# Patient Record
Sex: Female | Born: 1975 | Race: White | Hispanic: Yes | Marital: Married | Smoking: Never smoker
Health system: Southern US, Community
[De-identification: ages and names within clinical notes are randomized; demographics above are authoritative.]

## PROBLEM LIST (undated history)

## (undated) DIAGNOSIS — D649 Anemia, unspecified: Secondary | ICD-10-CM

## (undated) DIAGNOSIS — R002 Palpitations: Secondary | ICD-10-CM

## (undated) HISTORY — DX: Palpitations: R00.2

## (undated) HISTORY — DX: Anemia, unspecified: D64.9

---

## 1998-12-15 ENCOUNTER — Other Ambulatory Visit: Admission: RE | Admit: 1998-12-15 | Discharge: 1998-12-15 | Payer: Self-pay | Admitting: Orthopedic Surgery

## 2002-12-12 HISTORY — PX: OTHER SURGICAL HISTORY: SHX169

## 2005-11-14 ENCOUNTER — Other Ambulatory Visit: Admission: RE | Admit: 2005-11-14 | Discharge: 2005-11-14 | Payer: Self-pay | Admitting: Obstetrics and Gynecology

## 2007-01-11 ENCOUNTER — Inpatient Hospital Stay (HOSPITAL_COMMUNITY): Admission: AD | Admit: 2007-01-11 | Discharge: 2007-01-13 | Payer: Self-pay | Admitting: Obstetrics & Gynecology

## 2007-01-11 ENCOUNTER — Inpatient Hospital Stay (HOSPITAL_COMMUNITY): Admission: AD | Admit: 2007-01-11 | Discharge: 2007-01-11 | Payer: Self-pay | Admitting: Obstetrics & Gynecology

## 2009-08-12 LAB — CONVERTED CEMR LAB
Pap Smear: NORMAL
Pap Smear: NORMAL

## 2009-08-12 LAB — HM PAP SMEAR

## 2010-03-15 ENCOUNTER — Inpatient Hospital Stay (HOSPITAL_COMMUNITY): Admission: AD | Admit: 2010-03-15 | Discharge: 2010-03-17 | Payer: Self-pay | Admitting: Obstetrics & Gynecology

## 2010-05-26 LAB — HM DIABETES EYE EXAM: HM Diabetic Eye Exam: NORMAL

## 2010-08-11 ENCOUNTER — Ambulatory Visit: Payer: Self-pay | Admitting: Family Medicine

## 2010-08-11 DIAGNOSIS — O99019 Anemia complicating pregnancy, unspecified trimester: Secondary | ICD-10-CM | POA: Insufficient documentation

## 2010-08-11 DIAGNOSIS — L909 Atrophic disorder of skin, unspecified: Secondary | ICD-10-CM | POA: Insufficient documentation

## 2010-08-11 DIAGNOSIS — L989 Disorder of the skin and subcutaneous tissue, unspecified: Secondary | ICD-10-CM | POA: Insufficient documentation

## 2010-08-11 DIAGNOSIS — L919 Hypertrophic disorder of the skin, unspecified: Secondary | ICD-10-CM

## 2010-08-11 HISTORY — DX: Disorder of the skin and subcutaneous tissue, unspecified: L98.9

## 2010-08-11 HISTORY — DX: Atrophic disorder of skin, unspecified: L90.9

## 2010-08-11 LAB — CONVERTED CEMR LAB
Albumin: 4.1 g/dL (ref 3.5–5.2)
Alkaline Phosphatase: 68 units/L (ref 39–117)
Bilirubin, Direct: 0.1 mg/dL (ref 0.0–0.3)
Calcium: 9.4 mg/dL (ref 8.4–10.5)
GFR calc non Af Amer: 103.78 mL/min (ref >60)
HDL: 42.1 mg/dL (ref >39.00)
Sodium: 140 meq/L (ref 135–145)
Total CHOL/HDL Ratio: 4
VLDL: 24.4 mg/dL (ref 0.0–40.0)

## 2010-10-26 ENCOUNTER — Ambulatory Visit: Payer: Self-pay | Admitting: Internal Medicine

## 2010-12-20 LAB — HM DIABETES FOOT EXAM

## 2011-01-11 NOTE — Assessment & Plan Note (Signed)
Summary: FLU SHOT/EVM  Nurse Visit   Allergies: No Known Drug Allergies  Immunizations Administered:  Influenza Vaccine:    Vaccine Type: FLULAVAL    Site: left deltoid    Mfr: GlaxoSmithKline    Dose: 0.5 ml    Route: IM    Given by: Levonne Spiller EMT-P    Exp. Date: 06/11/2011    Lot #: [id number]    VIS given: 07/06/10 version given October 26, 2010.   Immunizations Administered:  Influenza Vaccine:    Vaccine Type: FLULAVAL    Site: left deltoid    Mfr: GlaxoSmithKline    Dose: 0.5 ml    Route: IM    Given by: Levonne Spiller EMT-P    Exp. Date: 06/11/2011    Lot #: [id number]    VIS given: 07/06/10 version given October 26, 2010.  Flu Vaccine Consent Questions:    Do you have a history of severe allergic reactions to this vaccine? no    Any prior history of allergic reactions to egg and/or gelatin? no    Do you have a sensitivity to the preservative Thimersol? no    Do you have a past history of Guillan-Barre Syndrome? no    Do you currently have an acute febrile illness? no    Have you ever had a severe reaction to latex? no    Vaccine information given and explained to patient? yes    Are you currently pregnant? no

## 2011-01-11 NOTE — Assessment & Plan Note (Signed)
Summary: NEW PT TO EST...CYD   Vital Signs:  Patient profile:   35 year old female Height:      63 inches Weight:      145.4 pounds BMI:     25.85 Temp:     98.7 degrees F oral Pulse rate:   76 / minute Pulse rhythm:   regular BP sitting:   100 / 70  (left arm) Cuff size:   regular  Vitals Entered By: Benny Lennert CMA Duncan Dull) (August 11, 2010 9:56 AM)  History of Present Illness: Chief complaint new patient to be established   GYN/OB...in past [facility]...Dr. Tenny Craw  Doing well overall.  Has had multiple moles on skin...no changes, no irritation, no bleeding. Has had several removed in past.Allergies:  benign.  HAs 2 skin tags pon left neck that get caught of seat belt..she requests these to be removed.  Preventive Screening-Counseling & Management  Alcohol-Tobacco     Smoking Status: never  Caffeine-Diet-Exercise     Does Patient Exercise: yes      Drug Use:  no.    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 Occupation:  employed Smoking Status:  never Drug Use:  no Does Patient Exercise:  yes  Review of Systems CV:  Denies chest pain or discomfort. Resp:  Denies shortness of breath. GI:  Denies abdominal pain, bloody stools, constipation, and diarrhea. GU:  Denies abnormal vaginal bleeding and dysuria. MS:  Denies joint pain. Derm:  Complains of lesion(s). Psych:  Denies anxiety,  depression, and panic attacks.  Physical Exam  General:  Well-developed,well-nourished,in no acute distress; alert,appropriate and cooperative throughout examination Eyes:  No corneal or conjunctival inflammation noted. EOMI. Perrla. Funduscopic exam benign, without hemorrhages, exudates or papilledema. Vision grossly normal. Ears:  External ear exam shows no significant lesions or deformities.  Otoscopic examination reveals clear canals, tympanic membranes are intact bilaterally without bulging, retraction, inflammation or discharge. Hearing is grossly normal bilaterally. Nose:  External nasal examination shows no deformity or inflammation. Nasal mucosa are pink and moist without lesions or exudates. Mouth:  Oral mucosa and oropharynx without lesions or exudates.  Teeth in good repair. Neck:  no carotid bruit or thyromegaly no cervical or supraclavicular lymphadenopathy  Lungs:  Normal respiratory effort, chest expands symmetrically. Lungs are clear to auscultation, no crackles or wheezes. Heart:  Normal rate and regular rhythm. S1 and S2 normal without gallop, murmur, click, rub or other extra sounds. Abdomen:  Bowel sounds positive,abdomen soft and non-tender without masses, organomegaly or hernias noted. Pulses:  R and L posterior tibial pulses are full and equal bilaterally  Extremities:  no edema  Skin:  multiple bengin appearing mo;les diffuse on body, several on feet and toes  2 irritated skin tags on left neck.  Psych:  Cognition and judgment appear intact. Alert and cooperative with normal attention span and concentration. No apparent delusions, illusions, hallucinations   Impression & Recommendations:  Problem # 1:  SKIN LESIONS, MULTIPLE (ICD-709.9) Benign appearing moles diffusely..no need for removal.  2 irritated skin tags on left neck...remove as below given irritation.  Problem # 2:  Preventive Health Care (ICD-V70.0) Assessment: Comment Only The patient's preventative  maintenance and recommended screening tests for an annual wellness exam were reviewed in full today. Brought up to date unless services declined.  Counselled on the importance of diet, exercise, and its role in overall health and mortality. The patient's FH and SH was reviewed, including their home life, tobacco status, and drug and alcohol status.     Problem # 3:  ACROCHORDON (ICD-701.9) Procedure Note: Area cleaned with alcohol. Cold spray used for anesthesia. Sterile scissors used for removeal at stalk x 2 . No bleeding.  Orders: Removal of Skin Tags up to 15 Lesions (11200)  Complete Medication List: 1)  Chooz 500 Mg Chew (Calcium carbonate antacid) .... One daily  Other Orders: TLB-BMP (Basic Metabolic Panel-BMET) (80048-METABOL) TLB-Hepatic/Liver Function Pnl (80076-HEPATIC) TLB-Lipid Panel (80061-LIPID) Tdap => 77yrs IM (16109) Admin 1st Vaccine (60454) Admin 1st Vaccine [facility]) [phone])  Patient Instructions: 1)  Please schedule a follow-up appointment in 1 year.  2)  Call sooner if any concerns.  Current Allergies (reviewed today): No known allergies   PAP Result Date:  08/12/2009 PAP Result:  normal PAP Next Due:  1 yr    Tetanus/Td Vaccine    Vaccine Type: Tdap    Site: right deltoid    Mfr: GlaxoSmithKline    Dose: 0.5 ml    Route: IM    Given by: Benny Lennert CMA (AAMA)    Exp. Date: 09/01/2012    Lot #: [id number]    VIS given: 10/30/07 version given August 11, 2010.

## 2011-03-02 LAB — CBC
Hemoglobin: 10.3 g/dL — ABNORMAL LOW (ref 12.0–15.0)
MCV: 97.9 fL (ref 78.0–100.0)
Platelets: 154 10*3/uL (ref 150–400)
RDW: 13.3 % (ref 11.5–15.5)
RDW: 13.3 % (ref 11.5–15.5)
WBC: 9.9 10*3/uL (ref 4.0–10.5)

## 2011-03-02 LAB — RPR: RPR Ser Ql: NONREACTIVE

## 2011-03-14 ENCOUNTER — Ambulatory Visit (INDEPENDENT_AMBULATORY_CARE_PROVIDER_SITE_OTHER): Payer: Self-pay | Admitting: Family Medicine

## 2011-03-14 ENCOUNTER — Encounter: Payer: Self-pay | Admitting: Family Medicine

## 2011-03-14 VITALS — BP 102/66 | HR 84 | Temp 98.6°F | Ht 63.0 in | Wt 148.0 lb

## 2011-03-14 DIAGNOSIS — J029 Acute pharyngitis, unspecified: Secondary | ICD-10-CM

## 2011-03-14 NOTE — Patient Instructions (Signed)
Drink plenty of fluids, take tylenol as needed, and this should gradually improve.  Take care.  Let us know if you have other concerns.  Glad to see you today.

## 2011-03-14 NOTE — Progress Notes (Signed)
Sore throat for 4 days.  Minimal phlegm production.  No other sx.  FCNAVD, no rash.  Minimal cough, from irritation in throat.  No myalgias. Hurts to swallow but improved today.   Meds, vitals, and allergies reviewed.   ROS: See HPI.  Otherwise, noncontributory.  GEN: nad, alert and oriented HEENT: mucous membranes moist, tm w/o erythema, nasal exam w/o erythema, clear discharge noted,  OP with cobblestoning but no exudates NECK: supple w/o LA CV: rrr.   PULM: ctab, no inc wob EXT: no edema SKIN: no acute rash

## 2011-03-14 NOTE — Assessment & Plan Note (Addendum)
Improving today, RST neg, nontoxic.  Likely viral.  Fu prn.  D/w pt and she agreed.

## 2011-03-15 ENCOUNTER — Encounter: Payer: Self-pay | Admitting: Family Medicine

## 2012-03-23 ENCOUNTER — Ambulatory Visit (INDEPENDENT_AMBULATORY_CARE_PROVIDER_SITE_OTHER): Payer: BC Managed Care – HMO | Admitting: Family Medicine

## 2012-03-23 ENCOUNTER — Encounter: Payer: Self-pay | Admitting: Family Medicine

## 2012-03-23 VITALS — BP 110/60 | HR 74 | Temp 98.5°F | Ht 63.0 in | Wt 141.1 lb

## 2012-03-23 DIAGNOSIS — R51 Headache: Secondary | ICD-10-CM

## 2012-03-23 DIAGNOSIS — H7391 Unspecified disorder of tympanic membrane, right ear: Secondary | ICD-10-CM | POA: Insufficient documentation

## 2012-03-23 MED ORDER — FLUTICASONE PROPIONATE 50 MCG/ACT NA SUSP: 2.0000 | Freq: Every day | NASAL | Status: DC

## 2012-03-23 NOTE — Assessment & Plan Note (Addendum)
R temporal pressure headache x1 wk started after recent viral URI. Anticipate fluid behind R TM from some ETD leading to sxs - treat with flonase for 1-2 wks. Return in 2-3wks for recheck. Update [place] if not improving, consider referral to ENT to r/o cholesteatoma.

## 2012-03-23 NOTE — Patient Instructions (Signed)
I think you have fluid in right ear leading to these symptoms. I would treat with course of nasal steroid to see if we can resolve quicker. If not improving please let us know.

## 2012-03-23 NOTE — Progress Notes (Signed)
  Subjective:    Patient ID: Barbara Mejia, female    DOB: January 05, 1976, 36 y.o.   MRN: [id number]  HPI CC: HA  Pleasant 36 yo with h/o gestational anemia presents with 1 wk h/o pressure headache in right temple region.  Described as pressure leading to dull ache.  Feels like ear is muffled as well.  Also feeling pressure behind eye.  Skin around temple feels sensitive as well.  Has tried ibuprofen x1 yesterday because was feeling sharper pains but NSAID didn't relieve pressure.  No h/o headaches in past.  No significant allergic rhinitis.  No recent swimming, draining from ears. Did feel ill at beginning of week (tail end of cold) but not currently more coryza or rhinorrhea than normal.    No fevers/chills, vision changes, ear pain or tooth pain.  No sneezing, PNdrainage, n/v.  Lab Results  Component Value Date   HGB 10.3 DELTA CHECK NOTED RESULT REPEATED AND VERIFIED* 03/16/2010   Past Medical History  Diagnosis Date  . Anemia     Gestational     Review of Systems Per HPI    Objective:   Physical Exam  Nursing note and vitals reviewed. Constitutional: She is oriented to person, place, and time. She appears well-developed and well-nourished. No distress.  HENT:  Head: Normocephalic and atraumatic.  Right Ear: Hearing, external ear and ear canal normal.  Left Ear: Hearing, tympanic membrane, external ear and ear canal normal.  Nose: No mucosal edema or rhinorrhea. Right sinus exhibits no maxillary sinus tenderness and no frontal sinus tenderness. Left sinus exhibits no maxillary sinus tenderness and no frontal sinus tenderness.  Mouth/Throat: Uvula is midline, oropharynx is clear and moist and mucous membranes are normal. No oropharyngeal exudate, posterior oropharyngeal edema, posterior oropharyngeal erythema or tonsillar abscesses.       Fullness behind R TM, slight bulge but no erythema, no pain with palpation of external ear, no LAD  Eyes: Conjunctivae and EOM are normal. Pupils  are equal, round, and reactive to light. No scleral icterus.  Neck: Normal range of motion. Neck supple.  Lymphadenopathy:    She has no cervical adenopathy.  Neurological: She is alert and oriented to person, place, and time. No cranial nerve deficit.       CN 2-12 intact  Skin: Skin is warm and dry. No rash noted.      Assessment & Plan:

## 2012-04-10 ENCOUNTER — Ambulatory Visit (INDEPENDENT_AMBULATORY_CARE_PROVIDER_SITE_OTHER): Payer: BC Managed Care – HMO | Admitting: Family Medicine

## 2012-04-10 ENCOUNTER — Encounter: Payer: Self-pay | Admitting: Family Medicine

## 2012-04-10 VITALS — BP 104/64 | HR 68 | Temp 98.2°F | Wt 143.8 lb

## 2012-04-10 DIAGNOSIS — G939 Disorder of brain, unspecified: Secondary | ICD-10-CM

## 2012-04-10 DIAGNOSIS — H7391 Unspecified disorder of tympanic membrane, right ear: Secondary | ICD-10-CM

## 2012-04-10 NOTE — Assessment & Plan Note (Signed)
Continued mild fullness/dullness R TM compared to left.  Doubt cholesteatoma, may just be normal variant. rec return for CPE with PCP in August and recheck ear, pt aware may decide on ENT referral if continued concern.

## 2012-04-10 NOTE — Progress Notes (Signed)
  Subjective:    Patient ID: Barbara Mejia, female    DOB: Apr 13, 1976, 36 y.o.   MRN: [id number]  HPI CC: recheck ear  Seen here 03/23/2012 with HA, thought some ETD and fullness behind R TM.  Treated with flonase.  Returns today to recheck ear.  Overall feeling well.  HA resolved, no further congestion.  No sxs currently.  Review of Systems Per HPI    Objective:   Physical Exam  Nursing note and vitals reviewed. Constitutional: She appears well-developed and well-nourished. No distress.  HENT:  Right Ear: Hearing, external ear and ear canal normal.  Left Ear: Hearing, tympanic membrane, external ear and ear canal normal.       Continued minimal dullness and fullness behind R TM.         Assessment & Plan:

## 2013-12-18 ENCOUNTER — Encounter: Payer: Self-pay | Admitting: Family Medicine

## 2013-12-18 ENCOUNTER — Ambulatory Visit (INDEPENDENT_AMBULATORY_CARE_PROVIDER_SITE_OTHER): Payer: BC Managed Care – HMO | Admitting: Family Medicine

## 2013-12-18 VITALS — BP 90/60 | HR 86 | Temp 98.6°F | Ht 63.0 in | Wt 150.0 lb

## 2013-12-18 DIAGNOSIS — Z2089 Contact with and (suspected) exposure to other communicable diseases: Secondary | ICD-10-CM

## 2013-12-18 DIAGNOSIS — Z20818 Contact with and (suspected) exposure to other bacterial communicable diseases: Secondary | ICD-10-CM

## 2013-12-18 LAB — POCT RAPID STREP A (OFFICE): Rapid Strep A Screen: NEGATIVE

## 2013-12-18 NOTE — Progress Notes (Signed)
>  5 minutes spent in face to face time with patient, >50% spent in counselling or coordination of care: daughter with strep. Patient strep test negative, no st, no LAD.   Likely virus last week.  Results for orders placed in visit on 12/18/13  POCT RAPID STREP A (OFFICE)      Result Value Range   Rapid Strep A Screen Negative  Negative     Exposure to strep throat - Plan: POCT rapid strep A  There are no Patient Instructions on file for this visit.  Orders Placed This Encounter  Procedures  . POCT rapid strep A    New medications, updates to list, dose adjustments: No orders of the defined types were placed in this encounter.    Signed,  Maud Deed. Reed Dady, MD, [place] at [facility] [place] [address] Phone: [phone] Fax: [phone]    Medication List       This list is accurate as of: 12/18/13 12:03 PM.  Always use your most recent med list.               CHOOZ 500 MG chewable tablet  Generic drug:  calcium carbonate  Chew 1 tablet by mouth daily.     multivitamin tablet  Take 1 tablet by mouth daily.

## 2013-12-18 NOTE — Progress Notes (Signed)
Pre-visit discussion using our clinic review tool. No additional management support is needed unless otherwise documented below in the visit note.  

## 2014-01-08 ENCOUNTER — Ambulatory Visit (INDEPENDENT_AMBULATORY_CARE_PROVIDER_SITE_OTHER): Payer: BC Managed Care – HMO | Admitting: Internal Medicine

## 2014-01-08 ENCOUNTER — Encounter: Payer: Self-pay | Admitting: Internal Medicine

## 2014-01-08 VITALS — BP 106/70 | HR 123 | Temp 100.2°F | Wt 146.0 lb

## 2014-01-08 DIAGNOSIS — J039 Acute tonsillitis, unspecified: Secondary | ICD-10-CM

## 2014-01-08 DIAGNOSIS — J020 Streptococcal pharyngitis: Secondary | ICD-10-CM

## 2014-01-08 DIAGNOSIS — J029 Acute pharyngitis, unspecified: Secondary | ICD-10-CM

## 2014-01-08 MED ORDER — PENICILLIN V POTASSIUM 500 MG PO TABS: 500.0000 mg | ORAL_TABLET | Freq: Three times a day (TID) | ORAL | Status: DC

## 2014-01-08 NOTE — Progress Notes (Signed)
Pre-visit discussion using our clinic review tool. No additional management support is needed unless otherwise documented below in the visit note.  

## 2014-01-08 NOTE — Addendum Note (Signed)
Addended by: Lurlean Nanny on: 01/08/2014 01:59 PM   Modules accepted: Orders

## 2014-01-08 NOTE — Progress Notes (Signed)
HPI  Pt presents to the clinic today with c/o sore throat, cough and chills. She reports this started 1 week ago. She reports that her throat feels tight. The cough is unproductive. She has been running fevers. Temp today in 100.2. She has been taking Nyquil and Ibuprofen. Both of her children were diagnosed with strep throat 2 weeks ago. She was seen for the same  12/18/13, RST was negative at that time.  Review of Systems      Past Medical History  Diagnosis Date  . Anemia     Gestational    Family History  Problem Relation Age of Onset  . Cancer Maternal Grandmother     Breast, late in life    History   Social History  . Marital Status: Married    Spouse Name: N/A    Number of Children: 2  . Years of Education: N/A   Occupational History  . 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 Smoking status: Never Smoker   . Smokeless tobacco: Never Used  . Alcohol Use: Yes     Comment: 1 glass of wine 3 times per week.  . Drug Use: No  . Sexual Activity: Not on file   Other Topics Concern  . Not on file   Social History Narrative   Exercise:  Yes, 3 days a week   Diet:  Fruits, veggies, moderate calcium in diet.    No Known Allergies   Constitutional: Positive headache, fatigue and fever. Denies abrupt weight changes.  HEENT:  Positive sore throat. Denies eye redness, eye pain, pressure behind the eyes, facial pain, nasal congestion, ear pain, ringing in the ears, wax buildup, runny nose or bloody nose. Respiratory: Positive cough. Denies difficulty breathing or shortness of breath.  Cardiovascular: Denies chest pain, chest tightness, palpitations or swelling in the hands or feet.   No other specific complaints in a complete review of systems (except as listed in HPI above).  Objective:   BP 106/70  Pulse 123  Temp(Src) 100.2 F (37.9 C) (Oral)  Wt 146 lb (66.225 kg)  SpO2 99%  LMP 12/14/2013 Wt Readings from Last 3  Encounters:  01/08/14 146 lb (66.225 kg)  12/18/13 150 lb (68.04 kg)  04/10/12 143 lb 12 oz (65.205 kg)     General: Appears her stated age, ill appearing but in NAD. HEENT: Head: normal shape and size; Eyes: sclera white, no icterus, conjunctiva pink, PERRLA and EOMs intact; Ears: Tm's gray and intact, normal light reflex; Nose: mucosa pink and moist, septum midline; Throat/Mouth: + PND. Teeth present, mucosa erythematous and moist, exudate noted on the right tonsillar pillar, no lesions or ulcerations noted.  Neck: Mild tonsillar lymphadenopathy. Neck supple, trachea midline. No massses, lumps or thyromegaly present.  Cardiovascular: Normal rate and rhythm. S1,S2 noted.  No murmur, rubs or gallops noted. No JVD or BLE edema. No carotid bruits noted. Pulmonary/Chest: Normal effort and positive vesicular breath sounds. No respiratory distress. No wheezes, rales or ronchi noted.      Assessment & Plan:   Pharyngitis/Tonsillitis secondary to suspected strep throat  RST- negative, will send throat culture Get some rest and drink plenty of water Do salt water gargles for the sore throat Will treat according to the Centor criteria- Pen V TID x 10 days  RTC as needed or if symptoms persist.

## 2014-01-08 NOTE — Progress Notes (Signed)
HPI: Pt presents to the office today with concerns of a sore throat. Symptoms started 3 days with fever, chills, body aches, and fatigue. She had sick contacts, children, who were both seen and treated for strep. Pt endorses difficulty/painful swallowing, cough, phlegm production, and loss of voice. Pt has tried OTC dayquil and nyquil, which helped some. Pt denies shortness of breath, difficulty breathing, headache, runny nose, or sinus pressure.   Past Medical History  Diagnosis Date  . Anemia     Gestational    Current Outpatient Prescriptions  Medication Sig Dispense Refill  . calcium carbonate (CHOOZ) 500 MG chewable tablet Chew 1 tablet by mouth daily.        . Multiple Vitamin (MULTIVITAMIN) tablet Take 1 tablet by mouth daily.         No current facility-administered medications for this visit.    No Known Allergies  Family History  Problem Relation Age of Onset  . Cancer Maternal Grandmother     Breast, late in life    History   Social History  . Marital Status: Married    Spouse Name: N/A    Number of Children: 2  . Years of Education: N/A   Occupational History  . 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 Smoking status: Never Smoker   . Smokeless tobacco: Never Used  . Alcohol Use: Yes     Comment: 1 glass of wine 3 times per week.  . Drug Use: No  . Sexual Activity: Not on file   Other Topics Concern  . Not on file   Social History Narrative   Exercise:  Yes, 3 days a week   Diet:  Fruits, veggies, moderate calcium in diet.    ROS:  Constitutional:Endorses fever, chills, body aches, fatigue.  Denies headache or abrupt weight changes.  HEENT:Endorses sore throat.  Denies eye pain, eye redness, ear pain, ringing in the ears, wax buildup, runny nose, nasal congestion, or bloody nose Respiratory:Endorses cough with clear sputum prodcution.  Denies difficulty breathing, shortness of breath.   Cardiovascular:  Denies chest pain, chest tightness, palpitations or swelling in the hands or feet.  Gastrointestinal:Endorses nausea.  Denies abdominal pain, bloating, constipation, diarrhea or blood in the stool.   No other specific complaints in a complete review of systems (except as listed in HPI above).  PE:  Wt 146 lb (66.225 kg)  LMP 12/14/2013 Wt Readings from Last 3 Encounters:  01/08/14 146 lb (66.225 kg)  12/18/13 150 lb (68.04 kg)  04/10/12 143 lb 12 oz (65.205 kg)    General: Appears their stated age, well developed, well nourished in NAD. HEENT: Head: normal shape and size; Eyes: sclera white, no icterus, conjunctiva pink, PERRLA and EOMs intact; Ears: Tm's gray and intact, normal light reflex; Nose: mucosa pink and moist, septum midline; Throat/Mouth: Teeth present, mucosa pink and moist, white exudate patches to right tonsil, red, swollen, uvula midline.   Neck: Normal range of motion. Neck supple, trachea midline. Cervical lymphadenopathy.   Cardiovascular: Tachycardic and normal rhythm. S1,S2 noted.  No murmur, rubs or gallops noted.  Pulmonary/Chest: Normal effort and positive vesicular breath sounds. No respiratory distress. No wheezes, rales or ronchi noted. Marland Kitchen Psychiatric: Mood and affect normal. Behavior is normal. Judgment and thought content normal.   Assessment and Plan: Sore throat:  Rapid strep negative, culture sent Possibly viral, seen 1/7 for same concern, will go ahead and treat based on Centor criteria Rx  sent for Pencillin V 500mg  PO TID for 10days; #30; no refills Supportive care, rest and fluids, warm salt water gargle. Tylenol or Ibuprofen for fever Follow up in 3-5 days if symptoms do not improve.   Westley Foots, Student-NP

## 2014-01-08 NOTE — Patient Instructions (Signed)
Pharyngitis °Pharyngitis is redness, pain, and swelling (inflammation) of your pharynx.  °CAUSES  °Pharyngitis is usually caused by infection. Most of the time, these infections are from viruses (viral) and are part of a cold. However, sometimes pharyngitis is caused by bacteria (bacterial). Pharyngitis can also be caused by allergies. Viral pharyngitis may be spread from person to person by coughing, sneezing, and personal items or utensils (cups, forks, spoons, toothbrushes). Bacterial pharyngitis may be spread from person to person by more intimate contact, such as kissing.  °SIGNS AND SYMPTOMS  °Symptoms of pharyngitis include:   °· Sore throat.   °· Tiredness (fatigue).   °· Low-grade fever.   °· Headache. °· Joint pain and muscle aches. °· Skin rashes. °· Swollen lymph nodes. °· Plaque-like film on throat or tonsils (often seen with bacterial pharyngitis). °DIAGNOSIS  °Your health care provider will ask you questions about your illness and your symptoms. Your medical history, along with a physical exam, is often all that is needed to diagnose pharyngitis. Sometimes, a rapid strep test is done. Other lab tests may also be done, depending on the suspected cause.  °TREATMENT  °Viral pharyngitis will usually get better in 3 4 days without the use of medicine. Bacterial pharyngitis is treated with medicines that kill germs (antibiotics).  °HOME CARE INSTRUCTIONS  °· Drink enough water and fluids to keep your urine clear or pale yellow.   °· Only take over-the-counter or prescription medicines as directed by your health care provider:   °· If you are prescribed antibiotics, make sure you finish them even if you start to feel better.   °· Do not take aspirin.   °· Get lots of rest.   °· Gargle with 8 oz of salt water (½ tsp of salt per 1 qt of water) as often as every 1 2 hours to soothe your throat.   °· Throat lozenges (if you are not at risk for choking) or sprays may be used to soothe your throat. °SEEK MEDICAL  CARE IF:  °· You have large, tender lumps in your neck. °· You have a rash. °· You cough up green, yellow-brown, or bloody spit. °SEEK IMMEDIATE MEDICAL CARE IF:  °· Your neck becomes stiff. °· You drool or are unable to swallow liquids. °· You vomit or are unable to keep medicines or liquids down. °· You have severe pain that does not go away with the use of recommended medicines. °· You have trouble breathing (not caused by a stuffy nose). °MAKE SURE YOU:  °· Understand these instructions. °· Will watch your condition. °· Will get help right away if you are not doing well or get worse. °Document Released: 11/28/2005 Document Revised: 09/18/2013 Document Reviewed: 08/05/2013 °[employer]® Patient Information ©2014 [employer]. ° °

## 2014-01-10 LAB — CULTURE, GROUP A STREP: ORGANISM ID, BACTERIA: NORMAL

## 2017-03-16 LAB — HM PAP SMEAR: HM Pap smear: NEGATIVE

## 2017-03-17 ENCOUNTER — Other Ambulatory Visit: Payer: Self-pay | Admitting: Obstetrics

## 2017-03-17 DIAGNOSIS — R928 Other abnormal and inconclusive findings on diagnostic imaging of breast: Secondary | ICD-10-CM

## 2017-03-27 ENCOUNTER — Ambulatory Visit
Admission: RE | Admit: 2017-03-27 | Discharge: 2017-03-27 | Disposition: A | Discharge status: Home / Self Care | Payer: BLUE CROSS/BLUE SHIELD | Source: Ambulatory Visit | Attending: Obstetrics | Admitting: Obstetrics

## 2017-03-27 DIAGNOSIS — R928 Other abnormal and inconclusive findings on diagnostic imaging of breast: Secondary | ICD-10-CM

## 2017-09-20 ENCOUNTER — Other Ambulatory Visit: Payer: Self-pay | Admitting: Obstetrics

## 2017-09-20 DIAGNOSIS — N63 Unspecified lump in unspecified breast: Secondary | ICD-10-CM

## 2017-09-20 DIAGNOSIS — R928 Other abnormal and inconclusive findings on diagnostic imaging of breast: Secondary | ICD-10-CM

## 2017-10-05 ENCOUNTER — Other Ambulatory Visit: Payer: Self-pay | Admitting: Obstetrics

## 2017-10-05 ENCOUNTER — Ambulatory Visit
Admission: RE | Admit: 2017-10-05 | Discharge: 2017-10-05 | Disposition: A | Discharge status: Home / Self Care | Payer: BLUE CROSS/BLUE SHIELD | Source: Ambulatory Visit | Attending: Obstetrics | Admitting: Obstetrics

## 2017-10-05 DIAGNOSIS — N63 Unspecified lump in unspecified breast: Secondary | ICD-10-CM

## 2018-04-09 ENCOUNTER — Ambulatory Visit
Admission: RE | Admit: 2018-04-09 | Discharge: 2018-04-09 | Disposition: A | Discharge status: Home / Self Care | Payer: BLUE CROSS/BLUE SHIELD | Source: Ambulatory Visit | Attending: Obstetrics | Admitting: Obstetrics

## 2018-04-09 DIAGNOSIS — N63 Unspecified lump in unspecified breast: Secondary | ICD-10-CM

## 2020-08-24 ENCOUNTER — Telehealth: Payer: Self-pay

## 2020-08-24 NOTE — Telephone Encounter (Signed)
[place] [facility] - Client TELEPHONE ADVICE RECORD [facility] Patient Name: Barbara Mejia [facility] Gender: Female DOB: 1976/12/10 Age: 44 Y 51 M 5 D Return Phone Number: [phone] (Primary) Address: City/State/Zip: [place] [address] Client [facility] - Client Client Site [place] Physician Eliezer Lofts - MD Contact Type Call Who Is Calling Patient / Member / Family / Caregiver Call Type Triage / Clinical Relationship To Patient Self Return Phone Number [phone] (Primary) Chief Complaint Sore Throat Reason for Call Symptomatic / Request for [name]. Translation No Nurse Assessment Nurse: Derrel Nip, RN, Santiago Glad Date/Time Eilene Ghazi Time): 08/22/2020 9:15:08 AM Confirm and document reason for call. If symptomatic, describe symptoms. ---Caller is call back because she has been unable to get a virtual appointment and continues to have a sore throat Has the patient had close contact with a person known or suspected to have the novel coronavirus illness OR traveled / lives in area with major community spread (including international travel) in the last 14 days from the onset of symptoms? * If Asymptomatic, screen for exposure and travel within the last 14 days. ---No Does the patient have any new or worsening symptoms? ---Yes Will a triage be completed? ---Yes Related visit to physician within the last 2 weeks? ---No Does the PT have any chronic conditions? (i.e. diabetes, asthma, this includes High risk factors for pregnancy, etc.) ---No Is the patient pregnant or possibly pregnant? (Ask all females between the ages of 42-55) ---No Is this a behavioral health or substance abuse call? ---No Disp. Time Eilene Ghazi Time) Disposition Final User 08/22/2020 9:24:52 AM Clinical Call Yes  Derrel Nip, RN, [place] Pellant Disagree/Comply Comply Caller Understands Yes PreDisposition Call DoctorPLEASE NOTE: All timestamps contained within this report are represented as [place] Standard Time. CONFIDENTIALTY NOTICE: This fax transmission is intended only for the addressee. It contains information that is legally privileged, confidential or otherwise protected from use or disclosure. If you are not the intended recipient, you are strictly prohibited from reviewing, disclosing, copying using or disseminating any of this information or taking any action in reliance on or regarding this information. If you have received this fax in error, please notify us immediately by telephone so that we can arrange for its return to [place]. Phone: [phone], Toll-Free: [phone], Fax: [phone] Page: 2 of 2 Call Id: [id number] Comments User: Eloisa Northern, RN Date/Time Eilene Ghazi Time): 08/22/2020 9:24:42 AM Patient has already done a triage and just wanted to know if she had the correct number for the Saturday clinic but it doesn't answer and has a horrible sound at the end Referrals [facility] [place] at [facility]

## 2020-08-24 NOTE — Telephone Encounter (Signed)
Contacted pt who reports when she contacted after hours nurse line twice she was given the same number and both times when trying that number there was no answer and no VM was available and the ringing sounded strange. She was not able to get in touch with anyone for a VV. She was then told by after hours nurse to go to urgent care for sore throat so she contacted them and the soonest they had was Monday. Pt reports now she no longer has a sore throat so no longer needs an apt. Apologized to pt for the confusion and inconvenience and trouble with the phone numbers.  Pt is requesting an apt with PCP though for her annual physical. She reports she does see Dr. Jerelyn Charles for OB-GYN every year but she needs her regular physical with Dr. Diona Browner. Advised seh has not seen Dr. Diona Browner in over 3 years so a msg would be sent to see if Dr. Diona Browner would keep her as a pt and this office would f/u with her concerning an apt. Pt verbalized understanding.

## 2020-08-24 NOTE — Telephone Encounter (Signed)
That would be fine 

## 2020-08-24 NOTE — Telephone Encounter (Signed)
[place] Night - Client TELEPHONE ADVICE RECORD [facility] Patient Name: Barbara Mejia [facility] Gender: Female DOB: 10-Jun-1976 Age: 44 Y 84 M 5 D Return Phone Number: [phone] (Primary) Address: City/State/Zip: [place]  [address] Client [facility] - Client Client Site [place] Physician Eliezer Lofts - MD Contact Type Call Who Is Calling Patient / Member / Family / Caregiver Call Type Triage / Clinical Relationship To Patient Self Return Phone Number [phone] (Primary) Chief Complaint Sore Throat Reason for Call Symptomatic / Request for [place] states she has a sore throat. Translation No Nurse Assessment Nurse: Loletha Carrow, RN, Ronalee Belts Date/Time (Eastern Time): 08/22/2020 7:21:10 AM Confirm and document reason for call. If symptomatic, describe symptoms. ---Caller states: Sore throat son has strep. denies any other symptoms Has the patient had close contact with a person known or suspected to have the novel coronavirus illness OR traveled / lives in area with major community spread (including international travel) in the last 14 days from the onset of symptoms? * If Asymptomatic, screen for exposure and travel within the last 14 days. ---No Does the patient have any new or worsening symptoms? ---Yes Will a triage be completed? ---Yes Related visit to physician within the last 2 weeks? ---No Does the PT have any chronic conditions? (i.e. diabetes, asthma, this includes High risk factors for pregnancy, etc.) ---No Is the patient pregnant or possibly pregnant? (Ask all females between the ages of 68-55) ---No Is this a behavioral health or substance abuse call? ---No Guidelines Guideline Title Affirmed Question Affirmed Notes Nurse Date/Time (Eastern Time) Sore Throat [1] Exposure to family member (or spouse or boyfriend/girlfriend) with test-proven strep AND  [2] within last 10 days Emch, RNRonalee Belts 08/22/2020 7:22:49 AM Disp. Time Eilene Ghazi Time) Disposition Final User 08/22/2020 7:28:09 AM Call PCP within 24 Hours Yes Emch, RN, MikePLEASE NOTE: All timestamps contained within this report are represented as [place] Standard Time. CONFIDENTIALTY NOTICE: This fax transmission is intended only for the addressee. It contains information that is legally privileged, confidential or otherwise protected from use or disclosure. If you are not the intended recipient, you are strictly prohibited from reviewing, disclosing, copying using or disseminating any of this information or taking any action in reliance on or regarding this information. If you have received this fax in error, please notify us immediately by telephone so that we can arrange for its return to [place]. Phone: [phone], Toll-Free: [phone], Fax: [phone] Page: 2 of 2 Call Id: [id number] [place] Disagree/Comply Comply Caller Understands Yes PreDisposition Did not know what to do Care Advice Given Per Guideline CALL PCP WITHIN 24 HOURS: * IF OFFICE WILL BE OPEN: Call the office when it opens tomorrow morning. SORE THROAT: * Here are some simple things you can do to treat and reduce sore throat pain. * Sip warm chicken broth or apple juice. * Gargle with warm salt water four times a day. To make salt water, put 1/2 teaspoon of salt in 8 oz (240 ml) of warm water. * For pain or fever relief, take either acetaminophen or ibuprofen. PAIN AND FEVER MEDICINES: * ACETAMINOPHEN REGULAR STRENGTH TYLENOL: Take 650 mg (two 325 mg pills) by mouth every 4-6 hours as needed. Each Regular Strength Tylenol pill has 325 mg of acetaminophen. The most you should take each day is 3,250 mg (10 pills a day). * IBUPROFEN (E.G., MOTRIN, ADVIL): Take 400 mg (two 200 mg pills) by mouth every  6 hours. The most you should take each day is 1,200 mg (six 200 mg pills), unless your doctor has told you to take more. * Eat a  soft diet. * Cold drinks, popsicles, and milk shakes are especially good. Avoid citrus fruits. * You become worse CALL BACK IF: CARE ADVICE given per Sore Throat (Adult) guideline. Referrals [facility]

## 2020-08-25 NOTE — Telephone Encounter (Signed)
Advised pt and scheduled pt for annual physical, no pap required. Pt appreciative and verbalized understanding.

## 2020-09-11 ENCOUNTER — Other Ambulatory Visit: Payer: Self-pay

## 2020-09-11 ENCOUNTER — Encounter: Payer: Self-pay | Admitting: Family Medicine

## 2020-09-11 ENCOUNTER — Ambulatory Visit (INDEPENDENT_AMBULATORY_CARE_PROVIDER_SITE_OTHER): Payer: BLUE CROSS/BLUE SHIELD | Admitting: Family Medicine

## 2020-09-11 VITALS — BP 100/70 | HR 93 | Temp 98.5°F | Ht 63.5 in | Wt 163.5 lb

## 2020-09-11 DIAGNOSIS — Z13220 Encounter for screening for lipoid disorders: Secondary | ICD-10-CM | POA: Diagnosis not present

## 2020-09-11 DIAGNOSIS — Z23 Encounter for immunization: Secondary | ICD-10-CM

## 2020-09-11 DIAGNOSIS — Z0000 Encounter for general adult medical examination without abnormal findings: Secondary | ICD-10-CM | POA: Diagnosis not present

## 2020-09-11 DIAGNOSIS — Z1159 Encounter for screening for other viral diseases: Secondary | ICD-10-CM | POA: Diagnosis not present

## 2020-09-11 NOTE — Patient Instructions (Addendum)
Schedule fasting labs on way out.  Call to set up mammogram. Set up GYN OV.   Preventive Care 39-43 Years Old, Female Preventive care refers to visits with your health care provider and lifestyle choices that can promote health and wellness. This includes:  A yearly physical exam. This may also be called an annual well check.  Regular dental visits and eye exams.  Immunizations.  Screening for certain conditions.  Healthy lifestyle choices, such as eating a healthy diet, getting regular exercise, not using drugs or products that contain nicotine and tobacco, and limiting alcohol use. What can I expect for my preventive care visit? Physical exam Your health care provider will check your:  Height and weight. This may be used to calculate body mass index (BMI), which tells if you are at a healthy weight.  Heart rate and blood pressure.  Skin for abnormal spots. Counseling Your health care provider may ask you questions about your:  Alcohol, tobacco, and drug use.  Emotional well-being.  Home and relationship well-being.  Sexual activity.  Eating habits.  Work and work Statistician.  Method of birth control.  Menstrual cycle.  Pregnancy history. What immunizations do I need?  Influenza (flu) vaccine  This is recommended every year. Tetanus, diphtheria, and pertussis (Tdap) vaccine  You may need a Td booster every 10 years. Varicella (chickenpox) vaccine  You may need this if you have not been vaccinated. Human papillomavirus (HPV) vaccine  If recommended by your health care provider, you may need three doses over 6 months. Measles, mumps, and rubella (MMR) vaccine  You may need at least one dose of MMR. You may also need a second dose. Meningococcal conjugate (MenACWY) vaccine  One dose is recommended if you are age 91-21 years and a first-year college student living in a residence hall, or if you have one of several medical conditions. You may also need  additional booster doses. Pneumococcal conjugate (PCV13) vaccine  You may need this if you have certain conditions and were not previously vaccinated. Pneumococcal polysaccharide (PPSV23) vaccine  You may need one or two doses if you smoke cigarettes or if you have certain conditions. Hepatitis A vaccine  You may need this if you have certain conditions or if you travel or work in places where you may be exposed to hepatitis A. Hepatitis B vaccine  You may need this if you have certain conditions or if you travel or work in places where you may be exposed to hepatitis B. Haemophilus influenzae type b (Hib) vaccine  You may need this if you have certain conditions. You may receive vaccines as individual doses or as more than one vaccine together in one shot (combination vaccines). Talk with your health care provider about the risks and benefits of combination vaccines. What tests do I need?  Blood tests  Lipid and cholesterol levels. These may be checked every 5 years starting at age 80.  Hepatitis C test.  Hepatitis B test. Screening  Diabetes screening. This is done by checking your blood sugar (glucose) after you have not eaten for a while (fasting).  Sexually transmitted disease (STD) testing.  BRCA-related cancer screening. This may be done if you have a family history of breast, ovarian, tubal, or peritoneal cancers.  Pelvic exam and Pap test. This may be done every 3 years starting at age 28. Starting at age 77, this may be done every 5 years if you have a Pap test in combination with an HPV test. Talk with  your health care provider about your test results, treatment options, and if necessary, the need for more tests. Follow these instructions at home: Eating and drinking   Eat a diet that includes fresh fruits and vegetables, whole grains, lean protein, and low-fat dairy.  Take vitamin and mineral supplements as recommended by your health care provider.  Do not  drink alcohol if: ? Your health care provider tells you not to drink. ? You are pregnant, may be pregnant, or are planning to become pregnant.  If you drink alcohol: ? Limit how much you have to 0-1 drink a day. ? Be aware of how much alcohol is in your drink. In the [place] one drink equals one 12 oz bottle of beer (355 mL), one 5 oz glass of wine (148 mL), or one 1 oz glass of hard liquor (44 mL). Lifestyle  Take daily care of your teeth and gums.  Stay active. Exercise for at least 30 minutes on 5 or more days each week.  Do not use any products that contain nicotine or tobacco, such as cigarettes, e-cigarettes, and chewing tobacco. If you need help quitting, ask your health care provider.  If you are sexually active, practice safe sex. Use a condom or other form of birth control (contraception) in order to prevent pregnancy and STIs (sexually transmitted infections). If you plan to become pregnant, see your health care provider for a preconception visit. What's next?  Visit your health care provider once a year for a well check visit.  Ask your health care provider how often you should have your eyes and teeth checked.  Stay up to date on all vaccines. This information is not intended to replace advice given to you by your health care provider. Make sure you discuss any questions you have with your health care provider. Document Revised: 08/09/2018 Document Reviewed: 08/09/2018 Elsevier Patient Education  2020 [employer].

## 2020-09-11 NOTE — Progress Notes (Signed)
Chief Complaint  Patient presents with  . Annual Exam    History of Present Illness: HPI  The patient is here for annual wellness exam and preventative care.    Feeling well overall.   Office Visit from 09/11/2020 in [place] at [facility]  PHQ-2 Total Score 0     Diet: poor  Exercise:  none.   This visit occurred during the SARS-CoV-2 public health emergency.  Safety protocols were in place, including screening questions prior to the visit, additional usage of staff PPE, and extensive cleaning of exam room while observing appropriate contact time as indicated for disinfecting solutions.   COVID 19 screen:  No recent travel or known exposure to COVID19 The patient denies respiratory symptoms of COVID 19 at this time. The importance of social distancing was discussed today.     Review of Systems  Constitutional: Negative for chills and fever.  HENT: Negative for congestion and ear pain.   Eyes: Negative for pain and redness.  Respiratory: Negative for cough and shortness of breath.   Cardiovascular: Negative for chest pain, palpitations and leg swelling.  Gastrointestinal: Negative for abdominal pain, blood in stool, constipation, diarrhea, nausea and vomiting.  Genitourinary: Negative for dysuria.  Musculoskeletal: Negative for falls and myalgias.  Skin: Negative for rash.  Neurological: Negative for dizziness.  Psychiatric/Behavioral: Negative for depression. The patient is not nervous/anxious.       Past Medical History:  Diagnosis Date  . Anemia    Gestational    reports that she has never smoked. She has never used smokeless tobacco. She reports current alcohol use. She reports that she does not use drugs.   Current Outpatient Medications:  .  calcium carbonate (CHOOZ) 500 MG chewable tablet, Chew 1 tablet by mouth daily.  , Disp: , Rfl:  .  Multiple Vitamin (MULTIVITAMIN) tablet, Take 1 tablet by mouth daily.  , Disp: , Rfl:     Observations/Objective: Blood pressure 100/70, pulse 93, temperature 98.5 F (36.9 C), temperature source Temporal, height 5' 3.5" (1.613 m), weight 163 lb 8 oz (74.2 kg), last menstrual period 08/04/2020, SpO2 98 %.   Wt Readings from Last 3 Encounters:  09/11/20 163 lb 8 oz (74.2 kg)  01/08/14 146 lb (66.2 kg)  12/18/13 150 lb (68 kg)   Body mass index is 28.51 kg/m.   Physical Exam Constitutional:      General: She is not in acute distress.    Appearance: Normal appearance. She is well-developed. She is not ill-appearing or toxic-appearing.  HENT:     Head: Normocephalic.     Right Ear: Hearing, tympanic membrane, ear canal and external ear normal.     Left Ear: Hearing, tympanic membrane, ear canal and external ear normal.     Nose: Nose normal.  Eyes:     General: Lids are normal. Lids are everted, no foreign bodies appreciated.     Conjunctiva/sclera: Conjunctivae normal.     Pupils: Pupils are equal, round, and reactive to light.  Neck:     Thyroid: No thyroid mass or thyromegaly.     Vascular: No carotid bruit.     Trachea: Trachea normal.  Cardiovascular:     Rate and Rhythm: Normal rate and regular rhythm.     Heart sounds: Normal heart sounds, S1 normal and S2 normal. No murmur heard.  No gallop.   Pulmonary:     Effort: Pulmonary effort is normal. No respiratory distress.     Breath sounds: Normal breath sounds.  No wheezing, rhonchi or rales.  Abdominal:     General: Bowel sounds are normal. There is no distension or abdominal bruit.     Palpations: Abdomen is soft. There is no fluid wave or mass.     Tenderness: There is no abdominal tenderness. There is no guarding or rebound.     Hernia: No hernia is present.  Musculoskeletal:     Cervical back: Normal range of motion and neck supple.  Lymphadenopathy:     Cervical: No cervical adenopathy.  Skin:    General: Skin is warm and dry.     Findings: No rash.  Neurological:     Mental Status: She is  alert.     Cranial Nerves: No cranial nerve deficit.     Sensory: No sensory deficit.  Psychiatric:        Mood and Affect: Mood is not anxious or depressed.        Speech: Speech normal.        Behavior: Behavior normal. Behavior is cooperative.        Judgment: Judgment normal.      Assessment and Plan   The patient's preventative maintenance and recommended screening tests for an annual wellness exam were reviewed in full today. Brought up to date unless services declined.  Counselled on the importance of diet, exercise, and its role in overall health and mortality. The patient's FH and SH was reviewed, including their home life, tobacco status, and drug and alcohol status.   Vaccines: Given flu and tetanus. S/P COVID series. Pap/DVE: sees GYN  Mammo:  03/2018 Bone Density: Colon:no early family history of co cancer  Smoking Status:none ETOH/ drug use: 4 glasses through a week.  Hep C:  Will check  HIV screen:  refused    Eliezer Lofts, MD

## 2020-09-17 ENCOUNTER — Other Ambulatory Visit: Payer: Self-pay

## 2020-09-17 ENCOUNTER — Other Ambulatory Visit (INDEPENDENT_AMBULATORY_CARE_PROVIDER_SITE_OTHER): Payer: BLUE CROSS/BLUE SHIELD

## 2020-09-17 DIAGNOSIS — Z13220 Encounter for screening for lipoid disorders: Secondary | ICD-10-CM | POA: Diagnosis not present

## 2020-09-17 DIAGNOSIS — Z1159 Encounter for screening for other viral diseases: Secondary | ICD-10-CM

## 2020-09-17 LAB — COMPREHENSIVE METABOLIC PANEL
ALT: 18 U/L (ref 0–35)
AST: 18 U/L (ref 0–37)
Albumin: 4 g/dL (ref 3.5–5.2)
Alkaline Phosphatase: 77 U/L (ref 39–117)
BUN: 14 mg/dL (ref 6–23)
CO2: 30 mEq/L (ref 19–32)
Calcium: 9.1 mg/dL (ref 8.4–10.5)
Chloride: 104 mEq/L (ref 96–112)
Creatinine, Ser: 0.69 mg/dL (ref 0.40–1.20)
GFR: 106.12 mL/min (ref >60.00)
Glucose, Bld: 88 mg/dL (ref 70–99)
Potassium: 3.8 mEq/L (ref 3.5–5.1)
Sodium: 138 mEq/L (ref 135–145)
Total Bilirubin: 0.4 mg/dL (ref 0.2–1.2)
Total Protein: 7.1 g/dL (ref 6.0–8.3)

## 2020-09-17 LAB — LIPID PANEL
Cholesterol: 167 mg/dL (ref 0–200)
HDL: 46.1 mg/dL (ref >39.00)
LDL Cholesterol: 85 mg/dL (ref 0–99)
NonHDL: 121.03
Total CHOL/HDL Ratio: 4
Triglycerides: 180 mg/dL — ABNORMAL HIGH (ref 0.0–149.0)
VLDL: 36 mg/dL (ref 0.0–40.0)

## 2020-09-17 NOTE — Progress Notes (Signed)
No critical labs need to be addressed urgently. We will discuss labs in detail at upcoming office visit.   

## 2020-09-18 LAB — HEPATITIS C ANTIBODY
Hepatitis C Ab: NONREACTIVE
SIGNAL TO CUT-OFF: 0.01 (ref <1.00)

## 2020-09-18 NOTE — Progress Notes (Signed)
No critical labs need to be addressed urgently. We will discuss labs in detail at upcoming office visit.   

## 2020-11-09 ENCOUNTER — Other Ambulatory Visit: Payer: Self-pay | Admitting: Obstetrics

## 2020-11-09 DIAGNOSIS — N6320 Unspecified lump in the left breast, unspecified quadrant: Secondary | ICD-10-CM

## 2020-12-16 ENCOUNTER — Other Ambulatory Visit: Payer: Self-pay

## 2020-12-16 ENCOUNTER — Other Ambulatory Visit: Payer: Self-pay | Admitting: Family Medicine

## 2020-12-16 ENCOUNTER — Ambulatory Visit
Admission: RE | Admit: 2020-12-16 | Discharge: 2020-12-16 | Disposition: A | Discharge status: Home / Self Care | Payer: BLUE CROSS/BLUE SHIELD | Source: Ambulatory Visit | Attending: Obstetrics | Admitting: Obstetrics

## 2020-12-16 DIAGNOSIS — N6320 Unspecified lump in the left breast, unspecified quadrant: Secondary | ICD-10-CM

## 2021-08-30 ENCOUNTER — Telehealth: Payer: Self-pay | Admitting: Family Medicine

## 2021-08-30 DIAGNOSIS — Z13220 Encounter for screening for lipoid disorders: Secondary | ICD-10-CM

## 2021-08-30 NOTE — Telephone Encounter (Signed)
-----   Message from Ellamae Sia sent at 08/25/2021 11:12 AM EDT ----- Regarding: Lab orders for Friday, 9.30.22 Patient is scheduled for CPX labs, please order future labs, Thanks , Karna Christmas

## 2021-09-10 ENCOUNTER — Other Ambulatory Visit: Payer: BLUE CROSS/BLUE SHIELD

## 2021-09-17 ENCOUNTER — Encounter: Payer: BLUE CROSS/BLUE SHIELD | Admitting: Family Medicine

## 2021-10-08 ENCOUNTER — Encounter: Payer: BLUE CROSS/BLUE SHIELD | Admitting: Family Medicine

## 2022-01-26 ENCOUNTER — Other Ambulatory Visit: Payer: Self-pay | Admitting: Obstetrics

## 2022-01-26 DIAGNOSIS — Z1231 Encounter for screening mammogram for malignant neoplasm of breast: Secondary | ICD-10-CM

## 2022-01-28 ENCOUNTER — Ambulatory Visit
Admission: RE | Admit: 2022-01-28 | Discharge: 2022-01-28 | Disposition: A | Discharge status: Home / Self Care | Payer: 59 | Source: Ambulatory Visit | Attending: Obstetrics | Admitting: Obstetrics

## 2022-01-28 DIAGNOSIS — Z1231 Encounter for screening mammogram for malignant neoplasm of breast: Secondary | ICD-10-CM

## 2022-01-28 LAB — HM MAMMOGRAPHY

## 2022-02-21 LAB — RESULTS CONSOLE HPV: CHL HPV: NEGATIVE

## 2022-03-02 LAB — HM PAP SMEAR: HM Pap smear: NEGATIVE

## 2022-04-18 ENCOUNTER — Ambulatory Visit (AMBULATORY_SURGERY_CENTER): Payer: 59 | Admitting: *Deleted

## 2022-04-18 VITALS — Ht 63.0 in | Wt 170.0 lb

## 2022-04-18 DIAGNOSIS — Z1211 Encounter for screening for malignant neoplasm of colon: Secondary | ICD-10-CM

## 2022-04-18 MED ORDER — NA SULFATE-K SULFATE-MG SULF 17.5-3.13-1.6 GM/177ML PO SOLN: 1.0000 | Freq: Once | ORAL | 0 refills | Status: AC

## 2022-04-18 NOTE — Progress Notes (Signed)

## 2022-04-28 ENCOUNTER — Encounter: Payer: Self-pay | Admitting: Gastroenterology

## 2022-05-02 ENCOUNTER — Encounter: Payer: Self-pay | Admitting: Gastroenterology

## 2022-05-02 ENCOUNTER — Ambulatory Visit (AMBULATORY_SURGERY_CENTER): Payer: 59 | Admitting: Gastroenterology

## 2022-05-02 VITALS — BP 108/52 | HR 70 | Temp 98.1°F | Resp 15 | Ht 63.5 in | Wt 170.0 lb

## 2022-05-02 DIAGNOSIS — D122 Benign neoplasm of ascending colon: Secondary | ICD-10-CM | POA: Diagnosis not present

## 2022-05-02 DIAGNOSIS — D123 Benign neoplasm of transverse colon: Secondary | ICD-10-CM

## 2022-05-02 DIAGNOSIS — Z1211 Encounter for screening for malignant neoplasm of colon: Secondary | ICD-10-CM | POA: Diagnosis present

## 2022-05-02 MED ORDER — SODIUM CHLORIDE 0.9 % IV SOLN: 500.0000 mL | Freq: Once | INTRAVENOUS | Status: DC

## 2022-05-02 NOTE — Progress Notes (Signed)
   Referring Provider: Jinny Sanders, MD Primary Care Physician:  Jinny Sanders, MD  Indication for Procedure:  Colon cancer screening   IMPRESSION:  Need for colon cancer screening Appropriate candidate for monitored anesthesia care  PLAN: Colonoscopy in the [place] today   HPI: Barbara Mejia is a 46 y.o. female presents for screening colonoscopy.  No prior colonoscopy or colon cancer screening.  No baseline GI symptoms.   No known family history of colon cancer or polyps. No family history of uterine/endometrial cancer, pancreatic cancer or gastric/stomach cancer.   Past Medical History:  Diagnosis Date   Anemia    Gestational    Past Surgical History:  Procedure Laterality Date   Torn meniscus  2004   Left Knee    No current outpatient medications on file.   Current Facility-Administered Medications  Medication Dose Route Frequency Provider Last Rate Last Admin   0.9 %  sodium chloride infusion  500 mL Intravenous Once [place], MD        Allergies as of 05/02/2022   (No Known Allergies)    Family History  Problem Relation Age of Onset   Cancer Maternal Grandmother        Breast, late in life   Breast cancer Maternal Grandmother    Colon cancer Neg Hx    Colon polyps Neg Hx    Esophageal cancer Neg Hx    Rectal cancer Neg Hx    Stomach cancer Neg Hx      Physical Exam: General:   Alert,  well-nourished, pleasant and cooperative in NAD Head:  Normocephalic and atraumatic. Eyes:  Sclera clear, no icterus.   Conjunctiva pink. Mouth:  No deformity or lesions.   Neck:  Supple; no masses or thyromegaly. Lungs:  Clear throughout to auscultation.   No wheezes. Heart:  Regular rate and rhythm; no murmurs. Abdomen:  Soft, non-tender, nondistended, normal bowel sounds, no rebound or guarding.  Msk:  Symmetrical. No boney deformities LAD: No inguinal or umbilical LAD Extremities:  No clubbing or edema. Neurologic:  Alert and  oriented x4;  grossly  nonfocal Skin:  No obvious rash or bruise. Psych:  Alert and cooperative. Normal mood and affect.     Studies/Results: No results found.    Ethelyne Erich L. Tarri Glenn, MD, MPH 05/02/2022, 9:31 AM

## 2022-05-02 NOTE — Progress Notes (Signed)
To pacu, VSS. Report to [email or web address].tb 

## 2022-05-02 NOTE — Addendum Note (Signed)
Addended by: Samantha Crimes on: 05/02/2022 03:41 PM   Modules accepted: Orders

## 2022-05-02 NOTE — Patient Instructions (Signed)
Thank you for coming in to see [place] today! Resume previous diet and medications today, return to normal daily activities tomorrow. Polyp biopsy results will be available in 1-2 weeks and recommendation will be made at time for next colonoscopy. Handout given.  YOU HAD AN ENDOSCOPIC PROCEDURE TODAY AT [facility]:   Refer to the procedure report that was given to you for any specific questions about what was found during the examination.  If the procedure report does not answer your questions, please call your gastroenterologist to clarify.  If you requested that your care partner not be given the details of your procedure findings, then the procedure report has been included in a sealed envelope for you to review at your convenience later.  YOU SHOULD EXPECT: Some feelings of bloating in the abdomen. Passage of more gas than usual.  Walking can help get rid of the air that was put into your GI tract during the procedure and reduce the bloating. If you had a lower endoscopy (such as a colonoscopy or flexible sigmoidoscopy) you may notice spotting of blood in your stool or on the toilet paper. If you underwent a bowel prep for your procedure, you may not have a normal bowel movement for a few days.  Please Note:  You might notice some irritation and congestion in your nose or some drainage.  This is from the oxygen used during your procedure.  There is no need for concern and it should clear up in a day or so.  SYMPTOMS TO REPORT IMMEDIATELY:  Following lower endoscopy (colonoscopy or flexible sigmoidoscopy):  Excessive amounts of blood in the stool  Significant tenderness or worsening of abdominal pains  Swelling of the abdomen that is new, acute  Fever of 100F or higher    For urgent or emergent issues, a gastroenterologist can be reached at any hour by calling [phone]. Do not use MyChart messaging for urgent concerns.    DIET:  We do recommend a small meal at first,  but then you may proceed to your regular diet.  Drink plenty of fluids but you should avoid alcoholic beverages for 24 hours.  ACTIVITY:  You should plan to take it easy for the rest of today and you should NOT DRIVE or use heavy machinery until tomorrow (because of the sedation medicines used during the test).    FOLLOW UP: Our staff will call the number listed on your records 48-72 hours following your procedure to check on you and address any questions or concerns that you may have regarding the information given to you following your procedure. If we do not reach you, we will leave a message.  We will attempt to reach you two times.  During this call, we will ask if you have developed any symptoms of COVID 19. If you develop any symptoms (ie: fever, flu-like symptoms, shortness of breath, cough etc.) before then, please call [phone].  If you test positive for Covid 19 in the 2 weeks post procedure, please call and report this information to [place].    If any biopsies were taken you will be contacted by phone or by letter within the next 1-3 weeks.  Please call us at [phone] if you have not heard about the biopsies in 3 weeks.    SIGNATURES/CONFIDENTIALITY: You and/or your care partner have signed paperwork which will be entered into your electronic medical record.  These signatures attest to the fact that that the information above on your  After Visit Summary has been reviewed and is understood.  Full responsibility of the confidentiality of this discharge information lies with you and/or your care-partner.

## 2022-05-02 NOTE — Progress Notes (Signed)
Pt's states no medical or surgical changes since previsit or office visit. VS assessed by C.W 

## 2022-05-02 NOTE — Progress Notes (Signed)
Called to room to assist during endoscopic procedure.  Patient ID and intended procedure confirmed with present staff. Received instructions for my participation in the procedure from the performing physician.  

## 2022-05-02 NOTE — Op Note (Signed)
[facility] Patient Name: Barbara Mejia Procedure Date: 05/02/2022 9:29 AM MRN: [id number] Endoscopist: Thornton Park MD, MD Age: 46 Referring MD:  Date of Birth: July 08, 1976 Gender: Female Account #: [id number] Procedure:                Colonoscopy Indications:              Screening for colorectal malignant neoplasm, This                            is the patient's first colonoscopy                           No known family history of colon cancer or polyps Medicines:                Monitored Anesthesia Care Procedure:                Pre-Anesthesia Assessment:                           - Prior to the procedure, a History and Physical                            was performed, and patient medications and                            allergies were reviewed. The patient's tolerance of                            previous anesthesia was also reviewed. The risks                            and benefits of the procedure and the sedation                            options and risks were discussed with the patient.                            All questions were answered, and informed consent                            was obtained. Prior Anticoagulants: The patient has                            taken no previous anticoagulant or antiplatelet                            agents. ASA Grade Assessment: II - A patient with                            mild systemic disease. After reviewing the risks                            and benefits, the patient was deemed in  satisfactory condition to undergo the procedure.                           After obtaining informed consent, the colonoscope                            was passed under direct vision. Throughout the                            procedure, the patient's blood pressure, pulse, and                            oxygen saturations were monitored continuously. The                            Olympus CF-HQ190L  [id number]) Colonoscope was                            introduced through the anus and advanced to the the                            cecum, identified by appendiceal orifice and                            ileocecal valve. A second forward view of the right                            colon was performed. The colonoscopy was performed                            without difficulty. The patient tolerated the                            procedure well. The quality of the bowel                            preparation was good. The terminal ileum, ileocecal                            valve, appendiceal orifice, and rectum were                            photographed. Scope In: 9:40:13 AM Scope Out: 9:51:52 AM Scope Withdrawal Time: 0 hours 9 minutes 20 seconds  Total Procedure Duration: 0 hours 11 minutes 39 seconds  Findings:                 The perianal and digital rectal examinations were                            normal.                           A few diverticula were found in the sigmoid colon,  descending colon and ascending colon.                           A 3 mm polyp was found in the transverse colon. The                            polyp was sessile. The polyp was removed with a                            cold snare. Resection and retrieval were complete.                            Estimated blood loss was minimal.                           A 3 mm polyp was found in the ascending colon. The                            polyp was sessile. The polyp was removed with a                            cold snare. Resection and retrieval were complete.                            Estimated blood loss was minimal. Complications:            No immediate complications. Estimated Blood Loss:     Estimated blood loss was minimal. Impression:               - Diverticulosis in the sigmoid colon, in the                            descending colon and in the ascending colon.                            - One 3 mm polyp in the transverse colon, removed                            with a cold snare. Resected and retrieved.                           - One 3 mm polyp in the ascending colon, removed                            with a cold snare. Resected and retrieved. Recommendation:           - Patient has a contact number available for                            emergencies. The signs and symptoms of potential                            delayed complications were discussed with the  patient. Return to normal activities tomorrow.                            Written discharge instructions were provided to the                            patient.                           - Resume previous diet.                           - Continue present medications.                           - Await pathology results.                           - Repeat colonoscopy date to be determined after                            pending pathology results are reviewed for                            surveillance.                           - Emerging evidence supports eating a diet of                            fruits, vegetables, grains, calcium, and yogurt                            while reducing red meat and alcohol may reduce the                            risk of colon cancer.                           - Thank you for allowing me to be involved in your                            colon cancer prevention. Thornton Park MD, MD 05/02/2022 9:59:14 AM This report has been signed electronically.

## 2022-05-03 ENCOUNTER — Telehealth: Payer: Self-pay

## 2022-05-03 NOTE — Telephone Encounter (Signed)
  Follow up Call-     05/02/2022    8:18 AM  Call back number  Post procedure Call Back phone  # [phone]  Permission to leave phone message Yes     Patient questions:  Do you have a fever, pain , or abdominal swelling? No. Pain Score  0 *  Have you tolerated food without any problems? Yes.    Have you been able to return to your normal activities? Yes.    Do you have any questions about your discharge instructions: Diet   No. Medications  No. Follow up visit  No.  Do you have questions or concerns about your Care? No.  Actions: * If pain score is 4 or above: No action needed, pain <4.

## 2022-05-04 ENCOUNTER — Encounter: Payer: Self-pay | Admitting: Gastroenterology

## 2022-07-25 IMAGING — MG MM DIGITAL SCREENING BILAT W/ TOMO AND CAD
8 series · 8 of 24 positions shown · non-contrast
Comparison: Previous exam(s).

CLINICAL DATA: Screening.

EXAM:
DIGITAL SCREENING BILATERAL MAMMOGRAM WITH TOMOSYNTHESIS AND CAD
TECHNIQUE: Bilateral screening digital craniocaudal and mediolateral oblique
mammograms were obtained. Bilateral screening digital breast
tomosynthesis was performed. The images were evaluated with
computer-aided detection.

[L CC synth-2D]
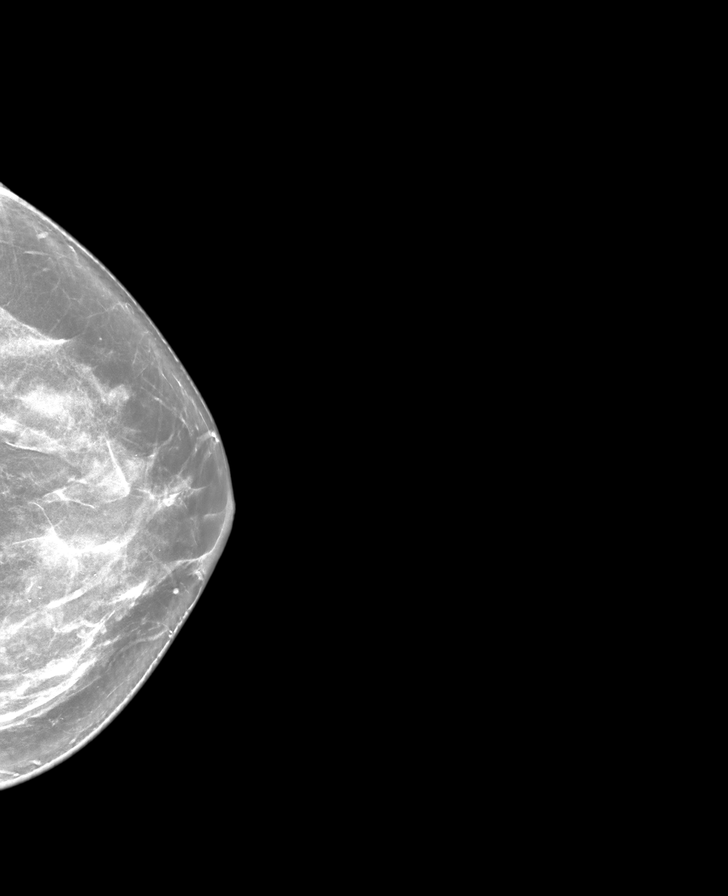

[R CC synth-2D]
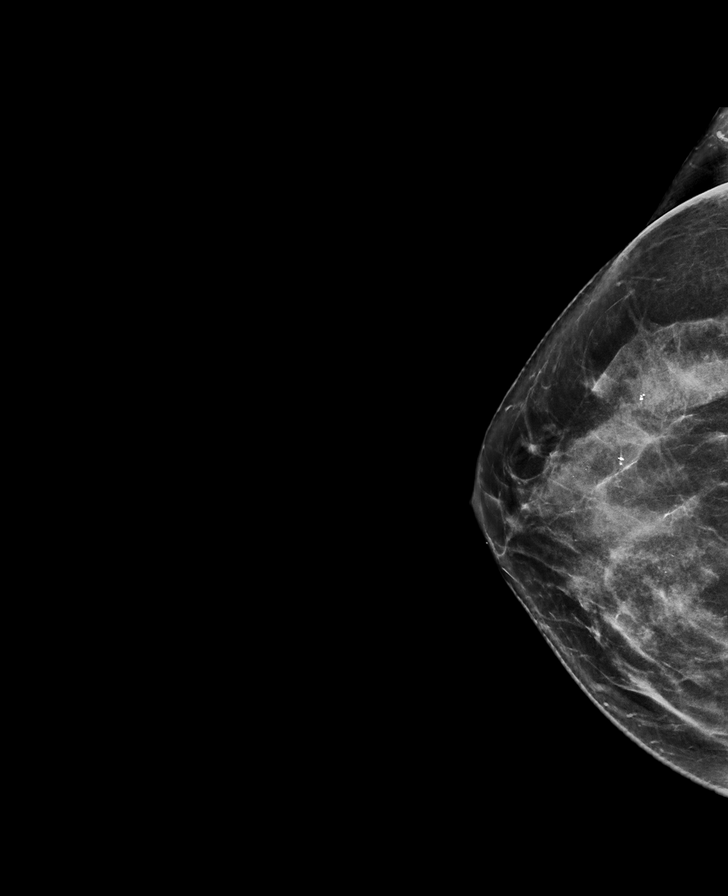

[L MLO synth-2D]
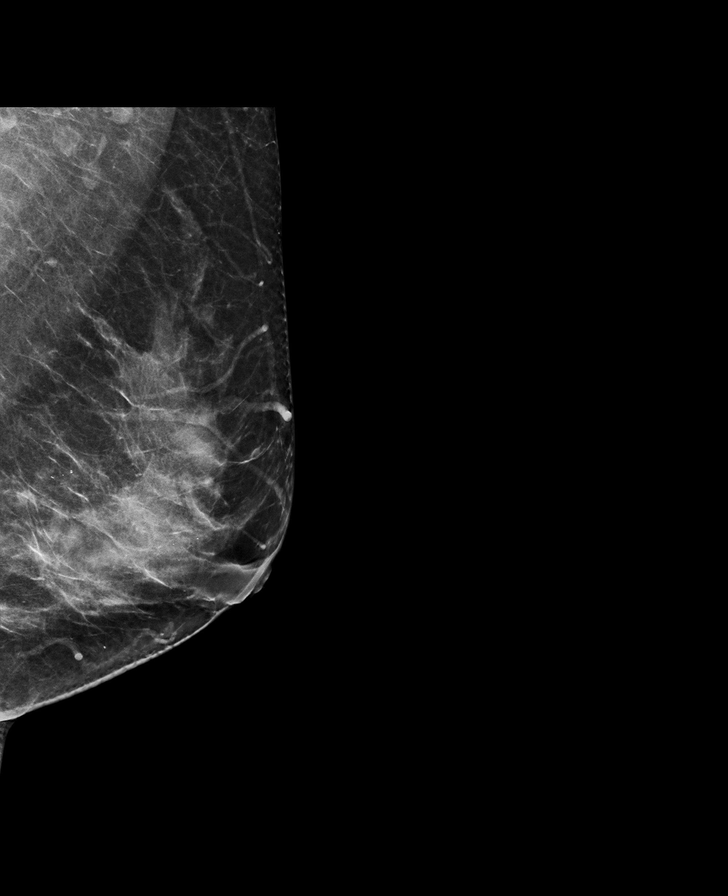

[R MLO synth-2D]
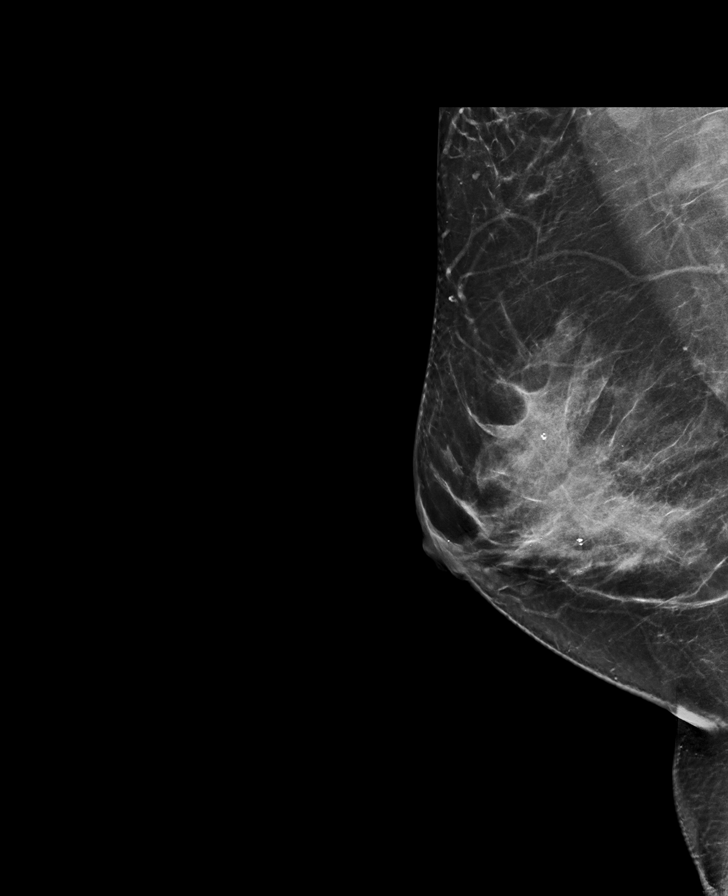

[L MLO tomo · tomo slice 37/73.0]
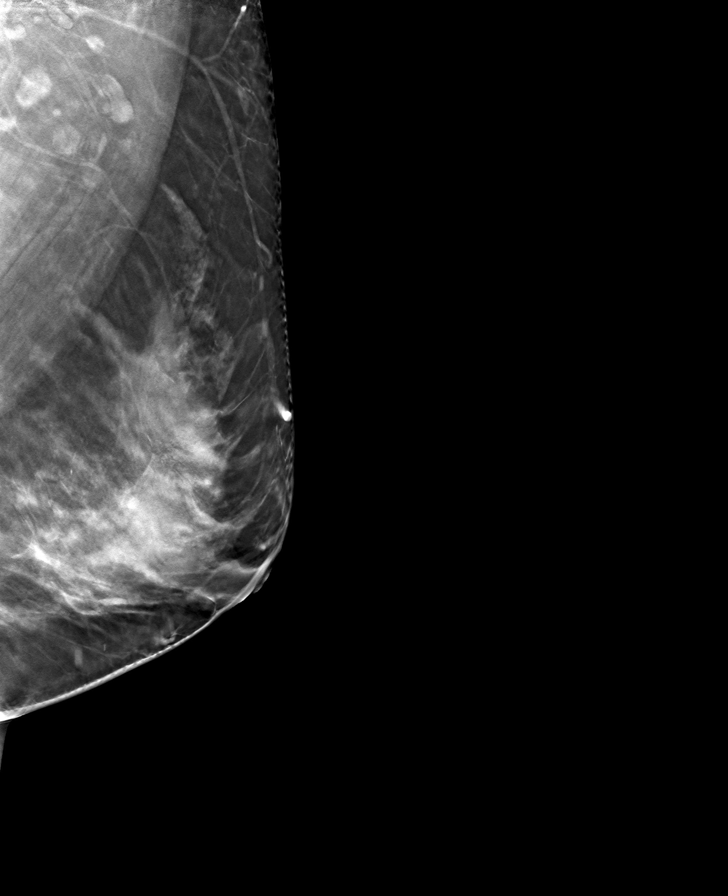

[L CC tomo · tomo slice 35/69.0]
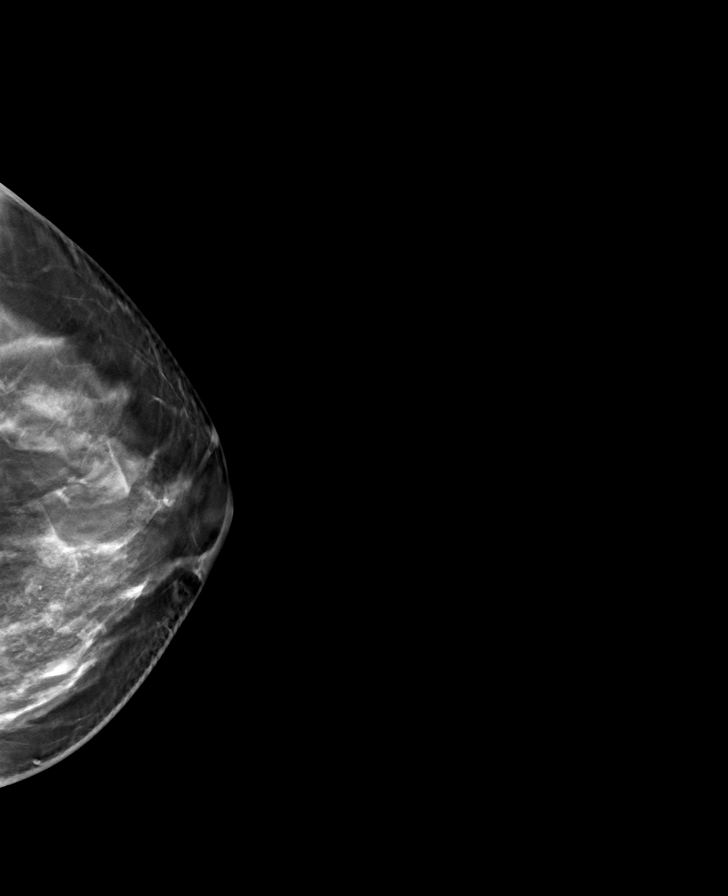

[R MLO tomo · tomo slice 41/80.0]
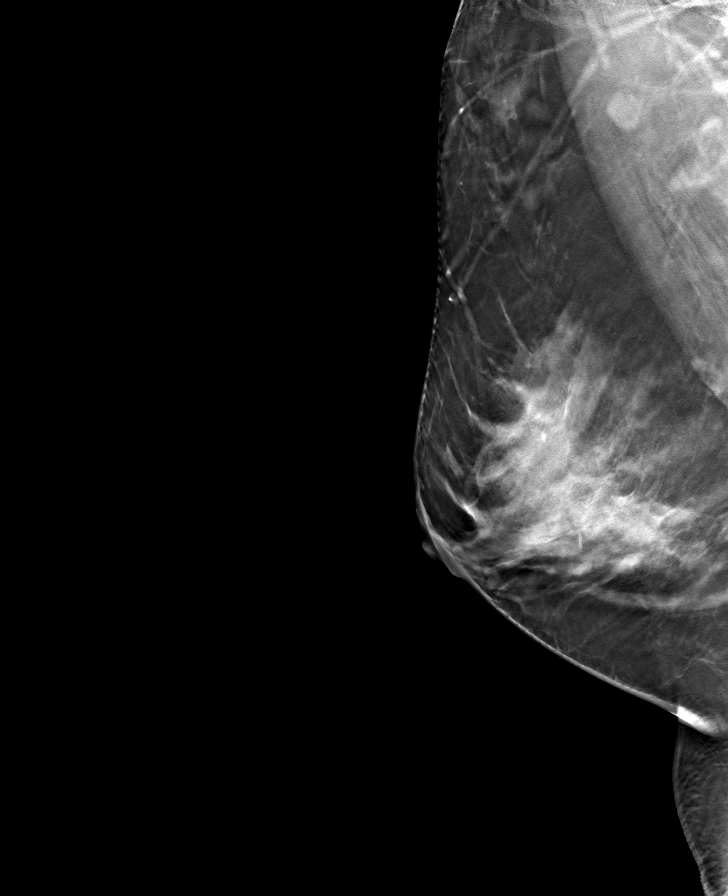

[R CC tomo · tomo slice 36/71.0]
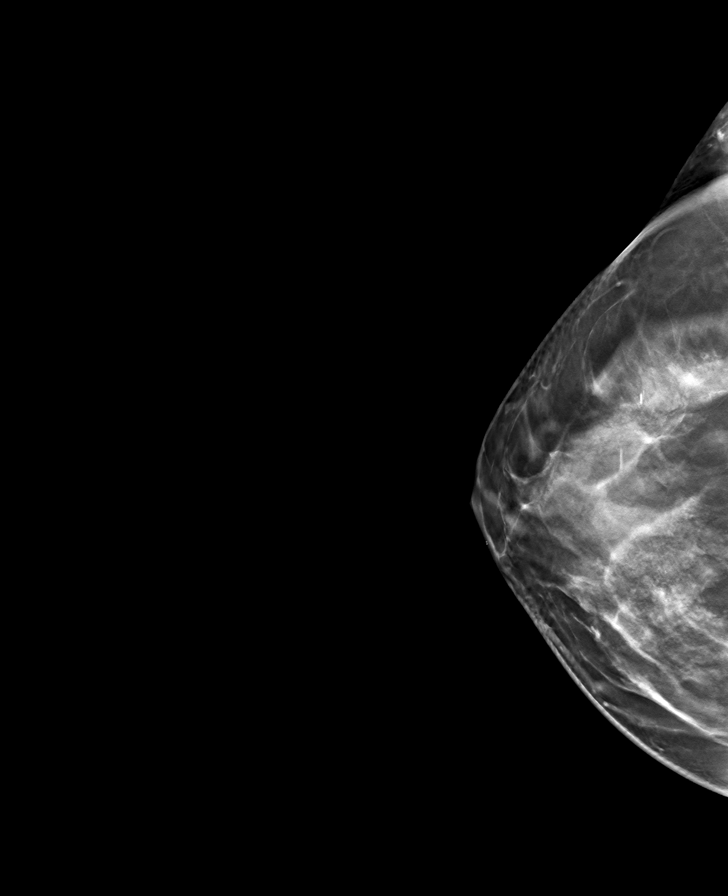

[8 of 24 positions shown; findings below may reference images not displayed]

ACR Breast Density Category c: The breast tissue is heterogeneously
dense, which may obscure small masses.
FINDINGS: There are no findings suspicious for malignancy.
IMPRESSION: No mammographic evidence of malignancy. A result letter of this
screening mammogram will be mailed directly to the patient.

RECOMMENDATION:
Screening mammogram in one year. (Code:Q3-W-BC3)

BI-RADS CATEGORY  1: Negative.

## 2022-11-28 ENCOUNTER — Telehealth: Payer: Self-pay | Admitting: Family Medicine

## 2022-11-28 DIAGNOSIS — Z13220 Encounter for screening for lipoid disorders: Secondary | ICD-10-CM

## 2022-11-28 NOTE — Telephone Encounter (Signed)
-----   Message from Velna Hatchet, RT sent at 11/21/2022  1:09 PM EST ----- Regarding: Wed 12/27 lab Patient is scheduled for cpx, please order future labs.  Thanks, Anda Kraft

## 2022-12-07 ENCOUNTER — Other Ambulatory Visit (INDEPENDENT_AMBULATORY_CARE_PROVIDER_SITE_OTHER): Payer: 59

## 2022-12-07 DIAGNOSIS — Z13220 Encounter for screening for lipoid disorders: Secondary | ICD-10-CM

## 2022-12-07 LAB — COMPREHENSIVE METABOLIC PANEL
ALT: 15 U/L (ref 0–35)
AST: 18 U/L (ref 0–37)
Albumin: 4 g/dL (ref 3.5–5.2)
Alkaline Phosphatase: 76 U/L (ref 39–117)
BUN: 15 mg/dL (ref 6–23)
CO2: 29 mEq/L (ref 19–32)
Calcium: 9.2 mg/dL (ref 8.4–10.5)
Chloride: 103 mEq/L (ref 96–112)
Creatinine, Ser: 0.75 mg/dL (ref 0.40–1.20)
GFR: 95.83 mL/min (ref >60.00)
Glucose, Bld: 88 mg/dL (ref 70–99)
Potassium: 4.1 mEq/L (ref 3.5–5.1)
Sodium: 138 mEq/L (ref 135–145)
Total Bilirubin: 0.4 mg/dL (ref 0.2–1.2)
Total Protein: 6.7 g/dL (ref 6.0–8.3)

## 2022-12-07 LAB — LDL CHOLESTEROL, DIRECT: Direct LDL: 81 mg/dL

## 2022-12-07 LAB — LIPID PANEL
Cholesterol: 169 mg/dL (ref 0–200)
HDL: 42.4 mg/dL (ref >39.00)
NonHDL: 127.07
Total CHOL/HDL Ratio: 4
Triglycerides: 276 mg/dL — ABNORMAL HIGH (ref 0.0–149.0)
VLDL: 55.2 mg/dL — ABNORMAL HIGH (ref 0.0–40.0)

## 2022-12-07 NOTE — Progress Notes (Signed)
No critical labs need to be addressed urgently. We will discuss labs in detail at upcoming office visit.   

## 2022-12-14 ENCOUNTER — Ambulatory Visit (INDEPENDENT_AMBULATORY_CARE_PROVIDER_SITE_OTHER): Payer: 59 | Admitting: Family Medicine

## 2022-12-14 ENCOUNTER — Encounter: Payer: Self-pay | Admitting: Family Medicine

## 2022-12-14 VITALS — BP 118/78 | HR 96 | Ht 63.5 in | Wt 175.0 lb

## 2022-12-14 DIAGNOSIS — Z0000 Encounter for general adult medical examination without abnormal findings: Secondary | ICD-10-CM | POA: Diagnosis not present

## 2022-12-14 NOTE — Progress Notes (Signed)
Patient ID: Barbara Mejia, female    DOB: Dec 10, 1976, 47 y.o.   MRN: [id number]  This visit was conducted in person.  BP 118/78   Pulse 96   Ht 5' 3.5" (1.613 m)   Wt 175 lb (79.4 kg)   LMP 12/06/2022   SpO2 96%   BMI 30.51 kg/m    CC:  Chief Complaint  Patient presents with   Annual Exam    Subjective:   HPI: Barbara Mejia is a 47 y.o. female presenting on 12/14/2022 for Annual Exam She reports she is doing well overall.   PHQ:0  Reviewed labs in detail with patient. Lab Results  Component Value Date   CHOL 169 12/07/2022   HDL 42.40 12/07/2022   LDLCALC 85 09/17/2020   LDLDIRECT 81.0 12/07/2022   TRIG 276.0 (H) 12/07/2022   CHOLHDL 4 12/07/2022  The 10-year ASCVD risk score (Arnett DK, et al., 2019) is: 0.8%   Values used to calculate the score:     Age: 53 years     Sex: Female     Is Non-Hispanic African American: No     Diabetic: No     Tobacco smoker: No     Systolic Blood Pressure: 540 mmHg     Is BP treated: No     HDL Cholesterol: 42.4 mg/dL     Total Cholesterol: 169 mg/dL   Diet: moderate Exercise: 2-3 times per week  Wt Readings from Last 3 Encounters:  12/14/22 175 lb (79.4 kg)  05/02/22 170 lb (77.1 kg)  04/18/22 170 lb (77.1 kg)        Relevant past medical, surgical, family and social history reviewed and updated as indicated. Interim medical history since our last visit reviewed. Allergies and medications reviewed and updated. No outpatient medications prior to visit.   No facility-administered medications prior to visit.     Per HPI unless specifically indicated in ROS section below Review of Systems  Constitutional:  Negative for fatigue and fever.  HENT:  Negative for congestion.   Eyes:  Negative for pain.  Respiratory:  Negative for cough and shortness of breath.   Cardiovascular:  Negative for chest pain, palpitations and leg swelling.  Gastrointestinal:  Negative for abdominal pain.  Genitourinary:  Negative for  dysuria and vaginal bleeding.  Musculoskeletal:  Negative for back pain.  Neurological:  Negative for syncope, light-headedness and headaches.  Psychiatric/Behavioral:  Negative for dysphoric mood.    Objective:  BP 118/78   Pulse 96   Ht 5' 3.5" (1.613 m)   Wt 175 lb (79.4 kg)   LMP 12/06/2022   SpO2 96%   BMI 30.51 kg/m   Wt Readings from Last 3 Encounters:  12/14/22 175 lb (79.4 kg)  05/02/22 170 lb (77.1 kg)  04/18/22 170 lb (77.1 kg)      Physical Exam Vitals and nursing note reviewed.  Constitutional:      General: She is not in acute distress.    Appearance: Normal appearance. She is well-developed. She is not ill-appearing or toxic-appearing.  HENT:     Head: Normocephalic.     Right Ear: Hearing, tympanic membrane, ear canal and external ear normal.     Left Ear: Hearing, tympanic membrane, ear canal and external ear normal.     Nose: Nose normal.  Eyes:     General: Lids are normal. Lids are everted, no foreign bodies appreciated.     Conjunctiva/sclera: Conjunctivae normal.     Pupils: Pupils are equal,  round, and reactive to light.  Neck:     Thyroid: No thyroid mass or thyromegaly.     Vascular: No carotid bruit.     Trachea: Trachea normal.  Cardiovascular:     Rate and Rhythm: Normal rate and regular rhythm.     Heart sounds: Normal heart sounds, S1 normal and S2 normal. No murmur heard.    No gallop.  Pulmonary:     Effort: Pulmonary effort is normal. No respiratory distress.     Breath sounds: Normal breath sounds. No wheezing, rhonchi or rales.  Abdominal:     General: Bowel sounds are normal. There is no distension or abdominal bruit.     Palpations: Abdomen is soft. There is no fluid wave or mass.     Tenderness: There is no abdominal tenderness. There is no guarding or rebound.     Hernia: No hernia is present.  Musculoskeletal:     Cervical back: Normal range of motion and neck supple.  Lymphadenopathy:     Cervical: No cervical adenopathy.   Skin:    General: Skin is warm and dry.     Findings: No rash.  Neurological:     Mental Status: She is alert.     Cranial Nerves: No cranial nerve deficit.     Sensory: No sensory deficit.  Psychiatric:        Mood and Affect: Mood is not anxious or depressed.        Speech: Speech normal.        Behavior: Behavior normal. Behavior is cooperative.        Judgment: Judgment normal.       Results for orders placed or performed in visit on 12/07/22  Comprehensive metabolic panel  Result Value Ref Range   Sodium 138 135 - 145 mEq/L   Potassium 4.1 3.5 - 5.1 mEq/L   Chloride 103 96 - 112 mEq/L   CO2 29 19 - 32 mEq/L   Glucose, Bld 88 70 - 99 mg/dL   BUN 15 6 - 23 mg/dL   Creatinine, Ser 0.75 0.40 - 1.20 mg/dL   Total Bilirubin 0.4 0.2 - 1.2 mg/dL   Alkaline Phosphatase 76 39 - 117 U/L   AST 18 0 - 37 U/L   ALT 15 0 - 35 U/L   Total Protein 6.7 6.0 - 8.3 g/dL   Albumin 4.0 3.5 - 5.2 g/dL   GFR 95.83 >60.00 mL/min   Calcium 9.2 8.4 - 10.5 mg/dL  Lipid panel  Result Value Ref Range   Cholesterol 169 0 - 200 mg/dL   Triglycerides 276.0 (H) 0.0 - 149.0 mg/dL   HDL 42.40 >39.00 mg/dL   VLDL 55.2 (H) 0.0 - 40.0 mg/dL   Total CHOL/HDL Ratio 4    NonHDL 127.07   LDL cholesterol, direct  Result Value Ref Range   Direct LDL 81.0 mg/dL     COVID 19 screen:  No recent travel or known exposure to COVID19 The patient denies respiratory symptoms of COVID 19 at this time. The importance of social distancing was discussed today.   Assessment and Plan   The patient's preventative maintenance and recommended screening tests for an annual wellness exam were reviewed in full today. Brought up to date unless services declined.  Counselled on the importance of diet, exercise, and its role in overall health and mortality. The patient's FH and SH was reviewed, including their home life, tobacco status, and drug and alcohol status.   Vaccines: Given fl vaccine  09/25/22  and uptodate  tetanus. S/P COVID series. Pap/DVE: sees GYN ... Will get records. Mammo:  01/28/2022 Bone Density: not indicated Colon: 04/2022 repeat due in 7 years.  Smoking Status: none ETOH/ drug use: 4 glasses through a week/ no drug use  Hep C:   done  HIV screen:  refused   Eliezer Lofts, MD

## 2022-12-14 NOTE — Progress Notes (Signed)
Sent fax requesting records to [facility] at the request of Dr. Diona Browner.  Received fax confirmation.

## 2022-12-19 ENCOUNTER — Encounter: Payer: Self-pay | Admitting: Family Medicine

## 2023-06-01 ENCOUNTER — Other Ambulatory Visit: Payer: Self-pay | Admitting: Family Medicine

## 2023-06-01 DIAGNOSIS — Z1231 Encounter for screening mammogram for malignant neoplasm of breast: Secondary | ICD-10-CM

## 2023-06-05 ENCOUNTER — Ambulatory Visit
Admission: RE | Admit: 2023-06-05 | Discharge: 2023-06-05 | Disposition: A | Discharge status: Home / Self Care | Payer: 59 | Source: Ambulatory Visit | Attending: Family Medicine | Admitting: Family Medicine

## 2023-06-05 DIAGNOSIS — Z1231 Encounter for screening mammogram for malignant neoplasm of breast: Secondary | ICD-10-CM

## 2024-01-29 ENCOUNTER — Ambulatory Visit: Payer: Self-pay | Admitting: Family Medicine

## 2024-01-29 ENCOUNTER — Emergency Department (HOSPITAL_COMMUNITY): Payer: Managed Care, Other (non HMO)

## 2024-01-29 ENCOUNTER — Emergency Department (HOSPITAL_COMMUNITY)
Admission: EM | Admit: 2024-01-29 | Discharge: 2024-01-29 | Disposition: A | Discharge status: Home / Self Care | Payer: Managed Care, Other (non HMO) | Attending: Emergency Medicine | Admitting: Emergency Medicine

## 2024-01-29 ENCOUNTER — Other Ambulatory Visit: Payer: Self-pay

## 2024-01-29 DIAGNOSIS — R002 Palpitations: Secondary | ICD-10-CM

## 2024-01-29 DIAGNOSIS — I493 Ventricular premature depolarization: Secondary | ICD-10-CM | POA: Insufficient documentation

## 2024-01-29 DIAGNOSIS — R079 Chest pain, unspecified: Secondary | ICD-10-CM | POA: Diagnosis present

## 2024-01-29 LAB — BASIC METABOLIC PANEL
Anion gap: 12 (ref 5–15)
BUN: 12 mg/dL (ref 6–20)
CO2: 21 mmol/L — ABNORMAL LOW (ref 22–32)
Calcium: 8.9 mg/dL (ref 8.9–10.3)
Chloride: 104 mmol/L (ref 98–111)
Creatinine, Ser: 0.69 mg/dL (ref 0.44–1.00)
GFR, Estimated: >60 mL/min (ref >60)
Glucose, Bld: 122 mg/dL — ABNORMAL HIGH (ref 70–99)
Potassium: 3.5 mmol/L (ref 3.5–5.1)
Sodium: 137 mmol/L (ref 135–145)

## 2024-01-29 LAB — CBC
HCT: 39.4 % (ref 36.0–46.0)
Hemoglobin: 13.1 g/dL (ref 12.0–15.0)
MCH: 31 pg (ref 26.0–34.0)
MCHC: 33.2 g/dL (ref 30.0–36.0)
MCV: 93.1 fL (ref 80.0–100.0)
Platelets: 318 10*3/uL (ref 150–400)
RBC: 4.23 MIL/uL (ref 3.87–5.11)
RDW: 12.7 % (ref 11.5–15.5)
WBC: 9.2 10*3/uL (ref 4.0–10.5)
nRBC: 0 % (ref 0.0–0.2)

## 2024-01-29 LAB — TROPONIN I (HIGH SENSITIVITY): Troponin I (High Sensitivity): 3 ng/L (ref <18)

## 2024-01-29 NOTE — ED Provider Notes (Signed)
[facility] EMERGENCY DEPARTMENT AT [facility] Provider Note   CSN: [id number] Arrival date & time: 01/29/24  4098     History  Chief Complaint  Patient presents with   Chest Pain    Barbara Mejia is a 48 y.o. female with no seeming past medical history presents emergency department complaining of palpitations and chest pressure for about a week.  Patient states that she is feeling intermittent "flutters" or "extra beats" intermittently.  This has been happening multiple times a day, and is becoming more frequent.  When they come on she has a sensation of pressure in her central chest and it briefly takes her breath away, it resolves within seconds. No aggravating or alleviating factors.  She has not had any syncopal episode.  Denies ever having had anything similar.  Denies any indigestion, nausea or vomiting.  2 weeks ago she had some chest congestion and cough, but that is resolved.  Otherwise nothing has been out of the ordinary for her.  She called her PCP regarding her symptoms and they recommended that she go to the ER for evaluation.   Chest Pain Associated symptoms: palpitations and shortness of breath        Home Medications Prior to Admission medications   Not on File      Allergies    Patient has no known allergies.    Review of Systems   Review of Systems  Respiratory:  Positive for shortness of breath.   Cardiovascular:  Positive for chest pain and palpitations.  Neurological:  Positive for light-headedness.  All other systems reviewed and are negative.   Physical Exam Updated Vital Signs BP 124/62 (BP Location: Left Arm)   Pulse 82   Temp 98 F (36.7 C) (Oral)   Resp 14   LMP 01/07/2024   SpO2 97%  Physical Exam Vitals and nursing note reviewed.  Constitutional:      Appearance: Normal appearance.  HENT:     Head: Normocephalic and atraumatic.  Eyes:     Conjunctiva/sclera: Conjunctivae normal.  Cardiovascular:     Rate and Rhythm:  Normal rate and regular rhythm. Occasional Extrasystoles are present. Pulmonary:     Effort: Pulmonary effort is normal. No respiratory distress.     Breath sounds: Normal breath sounds.  Abdominal:     General: There is no distension.     Palpations: Abdomen is soft.     Tenderness: There is no abdominal tenderness.  Skin:    General: Skin is warm and dry.  Neurological:     General: No focal deficit present.     Mental Status: She is alert.     ED Results / Procedures / Treatments   Labs (all labs ordered are listed, but only abnormal results are displayed) Labs Reviewed  BASIC METABOLIC PANEL - Abnormal; Notable for the following components:      Result Value   CO2 21 (*)    Glucose, Bld 122 (*)    All other components within normal limits  CBC  TROPONIN I (HIGH SENSITIVITY)    EKG EKG Interpretation Date/Time:  Monday January 29 2024 09:24:21 EST Ventricular Rate:  110 PR Interval:  134 QRS Duration:  80 QT Interval:  342 QTC Calculation: 462 R Axis:   90  Text Interpretation: Sinus tachycardia Rightward axis Nonspecific ST and T wave abnormality Abnormal ECG No previous ECGs available Confirmed by Eber Hong ([id number]) on 01/29/2024 10:28:20 AM  Radiology DG Chest 2 View Result Date: 01/29/2024  CLINICAL DATA:  Chest pain EXAM: CHEST - 2 VIEW COMPARISON:  None Available. FINDINGS: The heart size and mediastinal contours are within normal limits. Both lungs are clear. The visualized skeletal structures are unremarkable. IMPRESSION: No active cardiopulmonary disease. Electronically Signed   By: Minerva Fester M.D.   On: 01/29/2024 10:48    Procedures Procedures    Medications Ordered in ED Medications - No data to display  ED Course/ Medical Decision Making/ A&P                                 Medical Decision Making Amount and/or Complexity of Data Reviewed Labs: ordered. Radiology: ordered.   This patient is a 48 y.o. female  who presents to the ED  for concern of intermittent palpitations x 1 week.   Differential diagnoses prior to evaluation: The emergent differential diagnosis includes, but is not limited to,  Cardiac arrhythmias, ACS, CHF, pericarditis, valvular disease, panic/anxiety, ETOH, stimulant use, medication side effect, anemia, hyperthyroidism, pulmonary embolism. This is not an exhaustive differential.   Past Medical History / Co-morbidities / Social History: No significant PMH  Additional history: Chart reviewed. Pertinent results include: Reviewed PCP visit note from annual physical last year.  Physical Exam: Physical exam performed. The pertinent findings include: Heart regular rate, with intermittent PVCs, auscultated and visualized on cardiac monitor. Lung sounds clear.   Lab Tests/Imaging studies: I personally interpreted labs/imaging and the pertinent results include: CBC and BMP unremarkable.  Negative troponin.  Chest x-ray without acute abnormalities. I agree with the radiologist interpretation.  Cardiac monitoring: EKG obtained and interpreted by myself and attending physician which shows: Sinus tachycardia   Disposition: After consideration of the diagnostic results and the patients response to treatment, I feel that emergency department workup does not suggest an emergent condition requiring admission or immediate intervention beyond what has been performed at this time. The plan is: Discharge to home with reassurance.  Palpitations likely not of emergent cardiopulmonary etiology due to pattern of symptoms, EKG without acute ischemic changes, reassuring laboratory evaluation with negative troponin, and patient PERC negative for PE.  Will send referral to cardiologist, encouraged patient to reduce stress and caffeine levels, as well as keep record of symptoms.  The patient is safe for discharge and has been instructed to return immediately for worsening symptoms, change in symptoms or any other concerns.   Patient agreeable to the plan and all questions answered.  Final Clinical Impression(s) / ED Diagnoses Final diagnoses:  Palpitations  PVC (premature ventricular contraction)    Rx / DC Orders ED Discharge Orders          Ordered    Ambulatory referral to Cardiology       Comments: If you have not heard from the Cardiology office within the next 72 hours please call [phone].   01/29/24 1227           Portions of this report may have been transcribed using voice recognition software. Every effort was made to ensure accuracy; however, inadvertent computerized transcription errors may be present.    Jeanella Flattery 01/29/24 1326    Eber Hong, MD 01/30/24 [phone]

## 2024-01-29 NOTE — Discharge Instructions (Addendum)
You were seen in the emergency department today for chest pressure.  As we discussed your cardiac monitor showed premature ventricular contractions.  These can be very common.  They often times can be caused by some concerning cardiac findings, but we have ruled those out today.  You can try reducing your stress level, caffeine intake, and get good sleep.  I have sent a referral to the cardiologist for you.  If you have not heard from them within 72 hours, please call the phone number attached.  They often times when she wear a heart monitor, and maybe undergo some further testing.  Continue to monitor how you are doing and return to the ER for any new or worsening symptoms.

## 2024-01-29 NOTE — Telephone Encounter (Signed)
 Agree with POC

## 2024-01-29 NOTE — Telephone Encounter (Signed)
Barbara Mejia at front desk said E2C2 nurse called and nurse was not sure if pt was going to ED.I was unable to reach pt by phone and I spoke with pts husband (DPR signed) and he said that pt was going to ED by car within the next few minutes. Sending note to Dr Ermalene Searing who is out of office and Dr Patsy Lager who is in office and [place] [facility].

## 2024-01-29 NOTE — ED Triage Notes (Signed)
Pt reports intermittent pressure to center of chest x 1 week that is becoming more frequent.  States it feels like heart is skipping a beat and she has SOB when that occurs.  Denies nausea and vomiting.

## 2024-01-29 NOTE — Telephone Encounter (Signed)
Copied from CRM [id number]. Topic: Clinical - Red Word Triage >> Jan 29, 2024  8:04 AM Barbara Mejia wrote: Red Word that prompted transfer to Nurse Triage: Shortness of breathe,pressure in chest,heart palpitations   Chief Complaint: chest pain Symptoms: chest "pressure" sometimes lasting 30 min, heart palpitations, having to catch breath, dizziness Frequency: intermittent, increasing frequency Pertinent Negatives: Patient denies cardiac hx, sweating, nausea, fever, cough, severe pain Disposition: [] 911 / [] ED /[] Urgent Care (no appt availability in office) / [] Appointment(In office/virtual)/ []  [name]/ [] Home Care/ [x] Refused Recommended Disposition /[]  Mobile Bus/ []  Follow-up with PCP Additional Notes: Pt reporting started having symptoms "about a week ago," stating "almost like when you hold your breath and it's so intense then it'll pass, yesterday more constant, harder for it to pass," "more like when suddenly very scared and heart skips a beat, almost like waiting for heartbeat to come, so feeling pressure or whooshing" during that time, pressure "right in middle of chest." Pt reporting been lasting "normally a couple beats so maybe 30 sec, but yesterday half an hour and bouts were longer then come back" more frequently, chest pressure/pain is 3/10. Pt confirms no hx of cardiac or lung conditions, confirms not occurring with exertion. Pt reporting she has had "little bit" dizziness, has these episodes of chest pressure/palpitations/catching her breath at random, "in bed and it'll happen," pt states "happening now" while on phone with nurse, pt talking during episode. Pt confirms "yesterday constant pressure on my chest for half an hour." Advised pt call 911 for symptoms, offered to call 911 for pt, pt declines. Pt reporting she'll drive over to hospital. Advised another adult drive her, pt reporting she'll have daughter drive her. Advised call 911 if worsening, pt verbalized  understanding. Informed CAL.  Reason for Disposition  [1] Chest pain lasts > 5 minutes AND [2] age > 45  Answer Assessment - Initial Assessment Questions 1. LOCATION: "Where does it hurt?"       Denies pain or discomfort, just a pressure right in middle of chest 2. RADIATION: "Does the pain go anywhere else?" (e.g., into neck, jaw, arms, back)     denies 3. ONSET: "When did the chest pain begin?" (Minutes, hours or days)      Started about a week ago almost like when you hold your breath and it's so intense then it'll pass, yesterday more constant, harder for it to pass 4. PATTERN: "Does the pain come and go, or has it been constant since it started?"  "Does it get worse with exertion?"      Comes and goes, more like when suddenly very scared and heart skips a beat, almost like waiting for heartbeat to come, so feeling pressure or whooshing during that time 5. DURATION: "How long does it last" (e.g., seconds, minutes, hours)     Normally a couple beats maybe 30 sec, yesterday half an hour and bouts were longer then come back more frequently 6. SEVERITY: "How bad is the pain?"  (e.g., Scale 1-10; mild, moderate, or severe)    - MILD (1-3): doesn't interfere with normal activities     - MODERATE (4-7): interferes with normal activities or awakens from sleep    - SEVERE (8-10): excruciating pain, unable to do any normal activities       3/10 7. CARDIAC RISK FACTORS: "Do you have any history of heart problems or risk factors for heart disease?" (e.g., angina, prior heart attack; diabetes, high blood pressure, high cholesterol, smoker, or  strong family history of heart disease)     denies 8. PULMONARY RISK FACTORS: "Do you have any history of lung disease?"  (e.g., blood clots in lung, asthma, emphysema, birth control pills)     denies 9. CAUSE: "What do you think is causing the chest pain?"     I don't know, almost feels like you're suddenly scared 10. OTHER SYMPTOMS: "Do you have any other  symptoms?" (e.g., dizziness, nausea, vomiting, sweating, fever, difficulty breathing, cough)       Little bit dizziness but no not really, heart palpitations heart skipping beat because pressure rises because not happening then can take a breath, have to catch breath, feels like a sudden BP rising then calms down then can take a breath 11. PREGNANCY: "Is there any chance you are pregnant?" "When was your last menstrual period?"       denies  Protocols used: Chest Pain-A-AH

## 2024-02-01 ENCOUNTER — Ambulatory Visit: Payer: Managed Care, Other (non HMO) | Attending: Cardiology | Admitting: Cardiology

## 2024-02-01 ENCOUNTER — Ambulatory Visit: Payer: Managed Care, Other (non HMO)

## 2024-02-01 VITALS — BP 108/80 | HR 82 | Ht 63.0 in | Wt 186.0 lb

## 2024-02-01 DIAGNOSIS — R011 Cardiac murmur, unspecified: Secondary | ICD-10-CM | POA: Insufficient documentation

## 2024-02-01 DIAGNOSIS — D649 Anemia, unspecified: Secondary | ICD-10-CM | POA: Insufficient documentation

## 2024-02-01 DIAGNOSIS — E66811 Obesity, class 1: Secondary | ICD-10-CM | POA: Diagnosis not present

## 2024-02-01 DIAGNOSIS — R0609 Other forms of dyspnea: Secondary | ICD-10-CM | POA: Diagnosis not present

## 2024-02-01 DIAGNOSIS — R002 Palpitations: Secondary | ICD-10-CM

## 2024-02-01 NOTE — Progress Notes (Signed)
Cardiology Office Note:    Date:  02/01/2024   ID:  Barbara Mejia, DOB 09-10-1976, MRN [id number]  PCP:  Excell Seltzer, MD  Cardiologist:  Garwin Brothers, MD   Referring MD: Su Monks, PA-C    ASSESSMENT:    1. Palpitations   2. DOE (dyspnea on exertion)   3. Cardiac murmur   4. Obesity (BMI 30.0-34.9)    PLAN:    In order of problems listed above:  Primary prevention stressed with the patient.  Importance of compliance with diet medication stressed and patient verbalized standing. Dyspnea on exertion: Unclear etiology.  To reassure her and in view of the fact that she wants to start home excise program we will do an exercise stress echo. Cardiac murmur: Echocardiogram will be done to assess murmur heard on auscultation. Palpitations: Will get complete blood work including TSH.  I reviewed emergency room records.  Will do a 2-week monitor.  She is agreeable. Coronary risk stratification: I will do a fasting lipid panel.  She is agreeable. Obesity: Weight reduction stressed diet emphasized and she promises to do better. Patient will be seen in follow-up appointment in 6 months or earlier if the patient has any concerns.    Medication Adjustments/Labs and Tests Ordered: Current medicines are reviewed at length with the patient today.  Concerns regarding medicines are outlined above.  Orders Placed This Encounter  Procedures   LONG TERM MONITOR (3-14 DAYS)   EKG 12-Lead   ECHOCARDIOGRAM COMPLETE   ECHOCARDIOGRAM STRESS TEST   No orders of the defined types were placed in this encounter.    History of Present Illness:    Barbara Mejia is a 48 y.o. female who is being seen today for the evaluation of palpitations and dyspnea on exertion at the request of Roemhildt, Lorin T, PA-C.  Patient is a pleasant 48 year old female.  She has past medical history that is not much significant.  Overall she leads a sedentary lifestyle.  She is married and has 2 kids.  She  mentions to me that she has episodes of palpitations.  With this she has no dizziness or syncope.  At the time of my evaluation, the patient is alert awake oriented and in no distress.  She went to the emergency room because of these palpitation like symptoms and I reviewed those records.  Past Medical History:  Diagnosis Date   Anemia    Gestational   Disorder of skin or subcutaneous tissue 08/11/2010   Qualifier: Diagnosis of   By: Ermalene Searing MD, Amy      IMO SNOMED Dx Update Oct 2024     Hypertrophic and atrophic condition of skin 08/11/2010   Qualifier: Diagnosis of   By: Ermalene Searing MD, Amy      IMO SNOMED Dx Update Oct 2024     Palpitations     Past Surgical History:  Procedure Laterality Date   Torn meniscus  2004   Left Knee    Current Medications: No outpatient medications have been marked as taking for the 02/01/24 encounter (Office Visit) with Sukaina Zahler, Aundra Dubin, MD.     Allergies:   Patient has no known allergies.   Social History   Socioeconomic History   Marital status: Married    Spouse name: Not on file   Number of children: 2   Years of education: Not on file   Highest education level: Not on file  Occupational History   Occupation: Dietitian at [employer]  Tobacco Use   Smoking status: Never   Smokeless tobacco: Never  Vaping Use   Vaping status: Never Used  Substance and Sexual Activity   Alcohol use: Yes    Comment: 1 glass of wine 3 times per week.   Drug use: No   Sexual activity: Not on file  Other Topics Concern   Not on file  Social History Narrative   Exercise:  Yes, 3 days a week   Diet:  Fruits, veggies, moderate calcium in diet.   Social Drivers of Corporate investment banker Strain: Not on file  Food Insecurity: Not on file  Transportation Needs: Not on file  Physical Activity: Not on file  Stress: Not on file  Social Connections: Not on file     Family History: The patient's family history includes  Breast cancer in her maternal grandmother; Cancer in her maternal grandmother. There is no history of Colon cancer, Colon polyps, Esophageal cancer, Rectal cancer, or Stomach cancer.  ROS:   Please see the history of present illness.    All other systems reviewed and are negative.  EKGs/Labs/Other Studies Reviewed:    The following studies were reviewed today:        Recent Labs: 01/29/2024: BUN 12; Creatinine, Ser 0.69; Hemoglobin 13.1; Platelets 318; Potassium 3.5; Sodium 137  Recent Lipid Panel    Component Value Date/Time   CHOL 169 12/07/2022 0807   TRIG 276.0 (H) 12/07/2022 0807   HDL 42.40 12/07/2022 0807   CHOLHDL 4 12/07/2022 0807   VLDL 55.2 (H) 12/07/2022 0807   LDLCALC 85 09/17/2020 0844   LDLDIRECT 81.0 12/07/2022 0807    Physical Exam:    VS:  BP 108/80   Pulse 82   Ht 5\' 3"  (1.6 m)   Wt 186 lb (84.4 kg)   LMP 01/07/2024   SpO2 96%   BMI 32.95 kg/m     Wt Readings from Last 3 Encounters:  02/01/24 186 lb (84.4 kg)  12/14/22 175 lb (79.4 kg)  05/02/22 170 lb (77.1 kg)     GEN: Patient is in no acute distress HEENT: Normal NECK: No JVD; No carotid bruits LYMPHATICS: No lymphadenopathy CARDIAC: S1 S2 regular, 2/6 systolic murmur at the apex. RESPIRATORY:  Clear to auscultation without rales, wheezing or rhonchi  ABDOMEN: Soft, non-tender, non-distended MUSCULOSKELETAL:  No edema; No deformity  SKIN: Warm and dry NEUROLOGIC:  Alert and oriented x 3 PSYCHIATRIC:  Normal affect    Signed, Garwin Brothers, MD  02/01/2024 4:21 PM    [facility]

## 2024-02-01 NOTE — Patient Instructions (Signed)
Medication Instructions:  Your physician recommends that you continue on your current medications as directed. Please refer to the Current Medication list given to you today.  *If you need a refill on your cardiac medications before your next appointment, please call your pharmacy*   Lab Work: None ordered If you have labs (blood work) drawn today and your tests are completely normal, you will receive your results only by: MyChart Message (if you have MyChart) OR A paper copy in the mail If you have any lab test that is abnormal or we need to change your treatment, we will call you to review the results.   Testing/Procedures:  Stress Echocardiogram Instructions:    1. You may take all of your medications.  2. No food, drink or tobacco products four hours prior to your test.  3. Dress prepared to exercise. Best to wear 2 piece outfit and tennis shoes. Shoes must be closed toe.  4. Please bring all current prescription medications.  Your physician has requested that you have an echocardiogram. Echocardiography is a painless test that uses sound waves to create images of your heart. It provides your doctor with information about the size and shape of your heart and how well your heart's chambers and valves are working. This procedure takes approximately one hour. There are no restrictions for this procedure. Please do NOT wear cologne, perfume, aftershave, or lotions (deodorant is allowed). Please arrive 15 minutes prior to your appointment time.  Please note: We ask at that you not bring children with you during ultrasound (echo/ vascular) testing. Due to room size and safety concerns, children are not allowed in the ultrasound rooms during exams. Our front office staff cannot provide observation of children in our lobby area while testing is being conducted. An adult accompanying a patient to their appointment will only be allowed in the ultrasound room at the discretion of the  ultrasound technician under special circumstances. We apologize for any inconvenience.  A zio monitor was ordered today. It will remain on for 14 days. Remove 02/15/2024. You will then return monitor and event diary in provided box. It takes 1-2 weeks for report to be downloaded and returned to [place]. We will call you with the results. If monitor falls off or has orange flashing light, please call Zio for further instructions.  Follow-Up: At [facility], you and your health needs are our priority.  As part of our continuing mission to provide you with exceptional heart care, we have created designated Provider Care Teams.  These Care Teams include your primary Cardiologist (physician) and Advanced Practice Providers (APPs -  Physician Assistants and Nurse Practitioners) who all work together to provide you with the care you need, when you need it.  We recommend signing up for the patient portal called "MyChart".  Sign up information is provided on this After Visit Summary.  MyChart is used to connect with patients for Virtual Visits (Telemedicine).  Patients are able to view lab/test results, encounter notes, upcoming appointments, etc.  Non-urgent messages can be sent to your provider as well.   To learn more about what you can do with MyChart, go to [email or web address].    Your next appointment:   9 month(s)  The format for your next appointment:   In Person  Provider:   Belva Crome, MD   Other Instructions Exercise Stress Echocardiogram An exercise stress echocardiogram is a test to check how well your heart is working. This test uses sound waves and a computer  to make pictures of your heart. These pictures will be taken before and after you exercise. For this test, you will walk on a treadmill or ride a bicycle to make your heart beat faster. While you exercise, your heart will be checked with an electrocardiogram (ECG). Your blood pressure will also be checked. You may have this  test if: You have chest pain or a heart problem. You had a heart attack or heart surgery not long ago. You have heart valve problems. You have a condition that causes narrowing of the blood vessels that supply your heart. You have a high risk of heart disease and: You are starting a new exercise program. You need to have a big surgery. Tell a doctor about: Any allergies you have. All medicines you are taking. This includes vitamins, herbs, eye drops, creams, and over-the-counter medicines. Any problems you or family members have had with medicines that make you fall asleep (anesthetic medicines). Any surgeries you have had. Any blood disorders you have. Any medical conditions you have. Whether you are pregnant or may be pregnant. What are the risks? Generally, this is a safe test. However, problems may occur, including: Chest pain. Feeling dizzy or light-headed. Shortness of breath. Increased or irregular heartbeat. Feeling like you may vomit (nausea) or vomiting. Heart attack. This is very rare. What happens before the test? Medicines Ask your doctor about changing or stopping your normal medicines. This is important if you take diabetes medicines or blood thinners. If you use an inhaler, bring it to the test. General instructions Wear comfortable clothes and walking shoes. Follow instructions from your doctor about what you cannot eat or drink before the test. Do not drink or eat anything that has caffeine in it. Stop having caffeine 24 hours before the test. Do not smoke or use products that contain nicotine or tobacco for 4 hours before the test. If you need help quitting, ask your doctor. What happens during the test?  You will take off your clothes from the waist up and put on a hospital gown. Electrodes or patches will be put on your chest. A blood pressure cuff will be put on your arm. Before you exercise, a computer will make a picture of your heart. To do this: You  will lie down and a gel will be put on your chest. A wand will be moved over the gel. Sound waves from the wand will go to the computer to make the picture. Then, you will start to exercise. You may walk on a treadmill or pedal a bicycle. Your blood pressure and heart rhythm will be checked while you exercise. The exercise will get harder or faster. You will exercise until: Your heart reaches a certain level. You are too tired to go on. You cannot go on because of chest pain, weakness, or dizziness. You will lie down right away so another picture of your heart can be taken. The procedure may vary among doctors and hospitals. What can I expect after the test? After your test, it is common to have: Mild soreness. Mild tiredness. Your heart rate and blood pressure will be checked until they return to your normal levels. You should not have any new symptoms after this test. Follow these instructions at home: If your doctor says that you can, you may: Eat what you normally eat. Do your normal activities. Take over-the-counter and prescription medicines only as told by your doctor. Keep all follow-up visits. It is up to you to get  the results of your test. Ask how to get your results when they are ready. Contact a doctor if: You feel dizzy or light-headed. You have a fast or irregular heartbeat. You feel like you may vomit or you vomit. You have a headache. You feel short of breath. Get help right away if: You develop pain or pressure: In your chest. In your jaw or neck. Between your shoulders. That goes down your left arm. You faint. You have trouble breathing. These symptoms may be an emergency. Get medical help right away. Call your local emergency services (911 in the [place] Do not wait to see if the symptoms will go away. Do not drive yourself to the hospital. Summary This is a test that checks how well your heart is working. Follow instructions about what you cannot eat or  drink before the test. Ask your doctor if you should take your normal medicines before the test. Stop having caffeine 24 hours before the test. Do not smoke or use products with nicotine or tobacco in them for 4 hours before the test. During the test, your blood pressure and heart rhythm will be checked while you exercise. This information is not intended to replace advice given to you by your health care provider. Make sure you discuss any questions you have with your health care provider. Document Revised: 08/11/2021 Document Reviewed: 07/21/2020 Elsevier Patient Education  2022 Elsevier Inc.  Echocardiogram An echocardiogram is a test that uses sound waves to make images of your heart. This way of making images is often called ultrasound. The images from this test can help find out many things about your heart, including: The size and shape of your heart. The strength of your heart muscle and how well it's working. The size, thickness, and movement of your heart's walls. How your heart valves are working. Problems such as: A tumor or a growth from an infection around the heart valves. Areas of heart muscle that aren't working well because of poor blood flow or injury from a heart attack. An aneurysm. This is a weak or damaged part of an artery wall. An artery is a blood vessel. Tell a health care provider about: Any allergies you have. All medicines you're taking, including vitamins, herbs, eye drops, creams, and over-the-counter medicines. Any bleeding problems you have. Any surgeries you've had. Any medical problems you have. Whether you're pregnant or may be pregnant. What are the risks? Your health care provider will talk with you about risks. These may include an allergic reaction to IV dye that may be used during the test. What happens before the test? You don't need to do anything to get ready for this test. You may eat and drink normally. What happens during the  test?  You'll take off your clothes from the waist up and put on a hospital gown. Sticky patches called electrodes may be placed on your chest. These will be connected to a machine that monitors your heart rate and rhythm. You'll lie down on a table for the exam. A wand covered in gel will be moved over your chest. Sound waves from the wand will go to your heart and bounce back--or "echo" back. The sound waves will go to a computer that uses them to make images of your heart. The images can be viewed on a monitor. The images will also be recorded on the computer so your provider can look at them later. You may be asked to change positions or hold your breath  for a short time. This makes it easier to get different views or better views of your heart. In some cases, you may be given a dye through an IV. The IV is put into one of your veins. This dye can make the areas of your heart easier to see. The procedure may vary among providers and hospitals. What can I expect after the test? You may return to your normal diet, activities, and medicines unless your provider tells you not to. If an IV was placed for the test, it will be removed. It's up to you to get the results of your test. Ask your provider, or the department that's doing the test, when your results will be ready. This information is not intended to replace advice given to you by your health care provider. Make sure you discuss any questions you have with your health care provider. Document Revised: 01/27/2023 Document Reviewed: 01/27/2023 Elsevier Patient Education  2024 [employer].

## 2024-02-02 ENCOUNTER — Ambulatory Visit (HOSPITAL_COMMUNITY): Payer: Managed Care, Other (non HMO) | Attending: Cardiology

## 2024-02-02 DIAGNOSIS — R011 Cardiac murmur, unspecified: Secondary | ICD-10-CM | POA: Diagnosis present

## 2024-02-02 LAB — ECHOCARDIOGRAM COMPLETE
Area-P 1/2: 3.66 cm2
S' Lateral: 3.3 cm

## 2024-02-05 ENCOUNTER — Ambulatory Visit (HOSPITAL_COMMUNITY): Payer: Managed Care, Other (non HMO)

## 2024-02-27 ENCOUNTER — Telehealth (HOSPITAL_COMMUNITY): Payer: Self-pay | Admitting: *Deleted

## 2024-02-27 ENCOUNTER — Telehealth: Payer: Self-pay | Admitting: Cardiology

## 2024-02-27 NOTE — Telephone Encounter (Signed)
 Patient is inquiring to see what the PA status is on her test that she is having on 3/26.

## 2024-02-27 NOTE — Telephone Encounter (Signed)
Left message with instructions for upcoming stress echo.  Barbara Mejia

## 2024-02-29 DIAGNOSIS — R002 Palpitations: Secondary | ICD-10-CM | POA: Diagnosis not present

## 2024-03-06 ENCOUNTER — Ambulatory Visit (HOSPITAL_COMMUNITY): Payer: Managed Care, Other (non HMO)

## 2024-04-17 ENCOUNTER — Other Ambulatory Visit: Payer: Self-pay | Admitting: Family Medicine

## 2024-04-17 DIAGNOSIS — Z1231 Encounter for screening mammogram for malignant neoplasm of breast: Secondary | ICD-10-CM

## 2024-06-05 ENCOUNTER — Ambulatory Visit
Admission: RE | Admit: 2024-06-05 | Discharge: 2024-06-05 | Disposition: A | Discharge status: Home / Self Care | Source: Ambulatory Visit | Attending: Family Medicine | Admitting: Family Medicine

## 2024-06-05 DIAGNOSIS — Z1231 Encounter for screening mammogram for malignant neoplasm of breast: Secondary | ICD-10-CM

## 2024-06-07 ENCOUNTER — Ambulatory Visit: Payer: Self-pay | Admitting: Family Medicine

## 2024-06-10 ENCOUNTER — Other Ambulatory Visit: Payer: Self-pay | Admitting: Family Medicine

## 2024-06-10 ENCOUNTER — Encounter: Payer: Self-pay | Admitting: Family Medicine

## 2024-06-10 DIAGNOSIS — R928 Other abnormal and inconclusive findings on diagnostic imaging of breast: Secondary | ICD-10-CM

## 2024-06-17 ENCOUNTER — Ambulatory Visit
Admission: RE | Admit: 2024-06-17 | Discharge: 2024-06-17 | Disposition: A | Discharge status: Home / Self Care | Source: Ambulatory Visit | Attending: Family Medicine | Admitting: Family Medicine

## 2024-06-17 DIAGNOSIS — R928 Other abnormal and inconclusive findings on diagnostic imaging of breast: Secondary | ICD-10-CM

## 2024-06-18 ENCOUNTER — Ambulatory Visit: Payer: Self-pay | Admitting: Family Medicine
